# Patient Record
Sex: Male | Born: 1960 | Race: White | Hispanic: No | Marital: Married | Smoking: Never smoker
Health system: Southern US, Community
[De-identification: ages and names within clinical notes are randomized; demographics above are authoritative.]

## PROBLEM LIST (undated history)

## (undated) DIAGNOSIS — N400 Enlarged prostate without lower urinary tract symptoms: Secondary | ICD-10-CM

## (undated) DIAGNOSIS — I358 Other nonrheumatic aortic valve disorders: Secondary | ICD-10-CM

## (undated) DIAGNOSIS — E291 Testicular hypofunction: Secondary | ICD-10-CM

## (undated) DIAGNOSIS — E785 Hyperlipidemia, unspecified: Secondary | ICD-10-CM

## (undated) DIAGNOSIS — R972 Elevated prostate specific antigen [PSA]: Secondary | ICD-10-CM

## (undated) DIAGNOSIS — F419 Anxiety disorder, unspecified: Secondary | ICD-10-CM

## (undated) DIAGNOSIS — R51 Headache: Secondary | ICD-10-CM

## (undated) DIAGNOSIS — I351 Nonrheumatic aortic (valve) insufficiency: Secondary | ICD-10-CM

## (undated) DIAGNOSIS — I38 Endocarditis, valve unspecified: Secondary | ICD-10-CM

## (undated) DIAGNOSIS — I10 Essential (primary) hypertension: Secondary | ICD-10-CM

## (undated) DIAGNOSIS — N200 Calculus of kidney: Secondary | ICD-10-CM

## (undated) DIAGNOSIS — R519 Headache, unspecified: Secondary | ICD-10-CM

---

## 2013-01-13 DIAGNOSIS — N200 Calculus of kidney: Secondary | ICD-10-CM

## 2013-01-13 DIAGNOSIS — I38 Endocarditis, valve unspecified: Secondary | ICD-10-CM

## 2013-01-13 HISTORY — DX: Calculus of kidney: N20.0

## 2013-01-13 HISTORY — DX: Endocarditis, valve unspecified: I38

## 2013-07-04 ENCOUNTER — Observation Stay: Payer: Self-pay | Admitting: Student

## 2013-07-04 LAB — CBC
HCT: 41.4 % (ref 40.0–52.0)
HGB: 13.8 g/dL (ref 13.0–18.0)
MCH: 28.4 pg (ref 26.0–34.0)
MCHC: 33.4 g/dL (ref 32.0–36.0)
MCV: 85 fL (ref 80–100)
Platelet: 212 10*3/uL (ref 150–440)
RBC: 4.88 10*6/uL (ref 4.40–5.90)
RDW: 13.2 % (ref 11.5–14.5)
WBC: 11.1 10*3/uL — AB (ref 3.8–10.6)

## 2013-07-04 LAB — CK-MB
CK-MB: 4.4 ng/mL — ABNORMAL HIGH (ref 0.5–3.6)
CK-MB: 1.9 ng/mL (ref 0.5–3.6)

## 2013-07-04 LAB — COMPREHENSIVE METABOLIC PANEL
ALBUMIN: 3.6 g/dL (ref 3.4–5.0)
AST: 50 U/L — AB (ref 15–37)
Alkaline Phosphatase: 78 U/L
Anion Gap: 10 (ref 7–16)
BUN: 22 mg/dL — ABNORMAL HIGH (ref 7–18)
Bilirubin,Total: 0.6 mg/dL (ref 0.2–1.0)
CREATININE: 0.99 mg/dL (ref 0.60–1.30)
Calcium, Total: 9.7 mg/dL (ref 8.5–10.1)
Chloride: 103 mmol/L (ref 98–107)
Co2: 27 mmol/L (ref 21–32)
EGFR (African American): >60
EGFR (Non-African Amer.): >60
GLUCOSE: 103 mg/dL — AB (ref 65–99)
OSMOLALITY: 283 (ref 275–301)
Potassium: 3.5 mmol/L (ref 3.5–5.1)
SGPT (ALT): 45 U/L (ref 12–78)
SODIUM: 140 mmol/L (ref 136–145)
Total Protein: 7.1 g/dL (ref 6.4–8.2)

## 2013-07-04 LAB — TROPONIN I

## 2013-07-04 LAB — ETHANOL
Ethanol %: <0.003 % (ref 0.000–0.080)
Ethanol: <3 mg/dL

## 2013-07-05 LAB — CBC WITH DIFFERENTIAL/PLATELET
Basophil #: 0 10*3/uL (ref 0.0–0.1)
Basophil %: 0.2 %
EOS ABS: 0 10*3/uL (ref 0.0–0.7)
Eosinophil %: 0.4 %
HCT: 38.6 % — ABNORMAL LOW (ref 40.0–52.0)
HGB: 12.8 g/dL — ABNORMAL LOW (ref 13.0–18.0)
Lymphocyte #: 1.3 10*3/uL (ref 1.0–3.6)
Lymphocyte %: 11.6 %
MCH: 28.3 pg (ref 26.0–34.0)
MCHC: 33.3 g/dL (ref 32.0–36.0)
MCV: 85 fL (ref 80–100)
MONOS PCT: 8.9 %
Monocyte #: 1 x10 3/mm (ref 0.2–1.0)
Neutrophil #: 8.7 10*3/uL — ABNORMAL HIGH (ref 1.4–6.5)
Neutrophil %: 78.9 %
PLATELETS: 198 10*3/uL (ref 150–440)
RBC: 4.53 10*6/uL (ref 4.40–5.90)
RDW: 13.7 % (ref 11.5–14.5)
WBC: 11.1 10*3/uL — ABNORMAL HIGH (ref 3.8–10.6)

## 2013-07-05 LAB — BASIC METABOLIC PANEL
Anion Gap: 7 (ref 7–16)
BUN: 22 mg/dL — ABNORMAL HIGH (ref 7–18)
CALCIUM: 8.7 mg/dL (ref 8.5–10.1)
CO2: 27 mmol/L (ref 21–32)
Chloride: 107 mmol/L (ref 98–107)
Creatinine: 0.99 mg/dL (ref 0.60–1.30)
EGFR (African American): >60
Glucose: 83 mg/dL (ref 65–99)
Osmolality: 284 (ref 275–301)
Potassium: 3.5 mmol/L (ref 3.5–5.1)
Sodium: 141 mmol/L (ref 136–145)

## 2013-07-05 LAB — MAGNESIUM: Magnesium: 1.5 mg/dL — ABNORMAL LOW

## 2013-07-05 LAB — URINALYSIS, COMPLETE
BILIRUBIN, UR: NEGATIVE
GLUCOSE, UR: NEGATIVE mg/dL (ref 0–75)
Ketone: NEGATIVE
Nitrite: POSITIVE
PH: 7 (ref 4.5–8.0)
PROTEIN: NEGATIVE
SQUAMOUS EPITHELIAL: NONE SEEN
Specific Gravity: 1.014 (ref 1.003–1.030)
WBC UR: 127 /HPF (ref 0–5)

## 2013-07-07 LAB — URINE CULTURE

## 2013-07-07 LAB — CLOSTRIDIUM DIFFICILE(ARMC)

## 2013-07-10 LAB — CULTURE, BLOOD (SINGLE)

## 2013-09-26 ENCOUNTER — Ambulatory Visit: Payer: Self-pay | Admitting: Family Medicine

## 2013-09-30 ENCOUNTER — Emergency Department: Payer: Self-pay | Admitting: Emergency Medicine

## 2013-10-01 LAB — URINALYSIS, COMPLETE
BILIRUBIN, UR: NEGATIVE
Blood: NEGATIVE
Glucose,UR: NEGATIVE mg/dL (ref 0–75)
Ketone: NEGATIVE
Nitrite: NEGATIVE
PROTEIN: NEGATIVE
Ph: 6 (ref 4.5–8.0)
SPECIFIC GRAVITY: 1.019 (ref 1.003–1.030)
SQUAMOUS EPITHELIAL: NONE SEEN
WBC UR: 15 /HPF (ref 0–5)

## 2013-10-01 LAB — COMPREHENSIVE METABOLIC PANEL
ALK PHOS: 83 U/L
ALT: 44 U/L
ANION GAP: 7 (ref 7–16)
Albumin: 3.5 g/dL (ref 3.4–5.0)
BUN: 23 mg/dL — ABNORMAL HIGH (ref 7–18)
Bilirubin,Total: 0.4 mg/dL (ref 0.2–1.0)
CALCIUM: 8.7 mg/dL (ref 8.5–10.1)
CHLORIDE: 107 mmol/L (ref 98–107)
CREATININE: 1.08 mg/dL (ref 0.60–1.30)
Co2: 27 mmol/L (ref 21–32)
EGFR (African American): >60
EGFR (Non-African Amer.): >60
GLUCOSE: 111 mg/dL — AB (ref 65–99)
Osmolality: 286 (ref 275–301)
Potassium: 3.9 mmol/L (ref 3.5–5.1)
SGOT(AST): 26 U/L (ref 15–37)
Sodium: 141 mmol/L (ref 136–145)
TOTAL PROTEIN: 7.4 g/dL (ref 6.4–8.2)

## 2013-10-01 LAB — CBC
HCT: 45.2 % (ref 40.0–52.0)
HGB: 14.9 g/dL (ref 13.0–18.0)
MCH: 26.8 pg (ref 26.0–34.0)
MCHC: 33 g/dL (ref 32.0–36.0)
MCV: 81 fL (ref 80–100)
PLATELETS: 268 10*3/uL (ref 150–440)
RBC: 5.57 10*6/uL (ref 4.40–5.90)
RDW: 14 % (ref 11.5–14.5)
WBC: 6.6 10*3/uL (ref 3.8–10.6)

## 2013-10-03 LAB — URINE CULTURE

## 2013-10-31 ENCOUNTER — Ambulatory Visit: Payer: Self-pay | Admitting: Urology

## 2013-11-03 ENCOUNTER — Inpatient Hospital Stay: Payer: Self-pay

## 2013-11-03 LAB — CREATININE, SERUM
Creatinine: 0.89 mg/dL (ref 0.60–1.30)
EGFR (African American): >60

## 2013-11-03 LAB — URINALYSIS, COMPLETE
BACTERIA: NONE SEEN
Bilirubin,UR: NEGATIVE
Blood: NEGATIVE
Glucose,UR: NEGATIVE mg/dL (ref 0–75)
KETONE: NEGATIVE
Nitrite: NEGATIVE
PROTEIN: NEGATIVE
Ph: 5 (ref 4.5–8.0)
RBC,UR: 6 /HPF (ref 0–5)
Specific Gravity: 1.021 (ref 1.003–1.030)
Squamous Epithelial: 1
WBC UR: 23 /HPF (ref 0–5)

## 2013-11-04 LAB — CBC WITH DIFFERENTIAL/PLATELET
BASOS ABS: 0 10*3/uL (ref 0.0–0.1)
Basophil %: 0.5 %
EOS ABS: 0 10*3/uL (ref 0.0–0.7)
EOS PCT: 0.8 %
HCT: 37.3 % — AB (ref 40.0–52.0)
HGB: 12.3 g/dL — ABNORMAL LOW (ref 13.0–18.0)
LYMPHS PCT: 21.3 %
Lymphocyte #: 1.2 10*3/uL (ref 1.0–3.6)
MCH: 26.4 pg (ref 26.0–34.0)
MCHC: 33.1 g/dL (ref 32.0–36.0)
MCV: 80 fL (ref 80–100)
Monocyte #: 0.7 x10 3/mm (ref 0.2–1.0)
Monocyte %: 12.6 %
Neutrophil #: 3.8 10*3/uL (ref 1.4–6.5)
Neutrophil %: 64.8 %
Platelet: 187 10*3/uL (ref 150–440)
RBC: 4.68 10*6/uL (ref 4.40–5.90)
RDW: 14.2 % (ref 11.5–14.5)
WBC: 5.8 10*3/uL (ref 3.8–10.6)

## 2013-11-04 LAB — BASIC METABOLIC PANEL
ANION GAP: 8 (ref 7–16)
BUN: 23 mg/dL — AB (ref 7–18)
CO2: 28 mmol/L (ref 21–32)
Calcium, Total: 8.1 mg/dL — ABNORMAL LOW (ref 8.5–10.1)
Chloride: 105 mmol/L (ref 98–107)
Creatinine: 0.78 mg/dL (ref 0.60–1.30)
EGFR (Non-African Amer.): >60
Glucose: 128 mg/dL — ABNORMAL HIGH (ref 65–99)
Osmolality: 287 (ref 275–301)
Potassium: 3.9 mmol/L (ref 3.5–5.1)
Sodium: 141 mmol/L (ref 136–145)

## 2013-11-04 LAB — GENTAMICIN LEVEL, TROUGH: Gentamicin, Trough: <0.2 ug/mL — ABNORMAL LOW (ref 0.0–2.0)

## 2013-11-04 LAB — GENTAMICIN LEVEL, PEAK: Gentamicin, Peak: 4.8 ug/mL (ref 4.0–8.0)

## 2013-11-05 LAB — BASIC METABOLIC PANEL
Anion Gap: 7 (ref 7–16)
BUN: 17 mg/dL (ref 7–18)
CO2: 27 mmol/L (ref 21–32)
Calcium, Total: 8.2 mg/dL — ABNORMAL LOW (ref 8.5–10.1)
Chloride: 108 mmol/L — ABNORMAL HIGH (ref 98–107)
Creatinine: 0.74 mg/dL (ref 0.60–1.30)
EGFR (African American): >60
GLUCOSE: 103 mg/dL — AB (ref 65–99)
Osmolality: 285 (ref 275–301)
Potassium: 4.1 mmol/L (ref 3.5–5.1)
Sodium: 142 mmol/L (ref 136–145)

## 2013-11-05 LAB — URINE CULTURE

## 2013-11-05 LAB — VANCOMYCIN, TROUGH: Vancomycin, Trough: 9 ug/mL — ABNORMAL LOW (ref 10–20)

## 2013-11-06 LAB — BASIC METABOLIC PANEL
Anion Gap: 5 — ABNORMAL LOW (ref 7–16)
BUN: 18 mg/dL (ref 7–18)
CHLORIDE: 105 mmol/L (ref 98–107)
CO2: 31 mmol/L (ref 21–32)
Calcium, Total: 8.5 mg/dL (ref 8.5–10.1)
Creatinine: 0.83 mg/dL (ref 0.60–1.30)
EGFR (African American): >60
Glucose: 97 mg/dL (ref 65–99)
Osmolality: 283 (ref 275–301)
POTASSIUM: 4.3 mmol/L (ref 3.5–5.1)
Sodium: 141 mmol/L (ref 136–145)

## 2013-11-06 LAB — CULTURE, BLOOD (SINGLE)

## 2013-11-06 LAB — CBC WITH DIFFERENTIAL/PLATELET
Basophil #: 0 10*3/uL (ref 0.0–0.1)
Basophil %: 0.6 %
EOS PCT: 1.2 %
Eosinophil #: 0.1 10*3/uL (ref 0.0–0.7)
HCT: 38 % — AB (ref 40.0–52.0)
HGB: 12.1 g/dL — ABNORMAL LOW (ref 13.0–18.0)
Lymphocyte #: 1.7 10*3/uL (ref 1.0–3.6)
Lymphocyte %: 32.5 %
MCH: 25.5 pg — ABNORMAL LOW (ref 26.0–34.0)
MCHC: 31.9 g/dL — AB (ref 32.0–36.0)
MCV: 80 fL (ref 80–100)
MONO ABS: 0.6 x10 3/mm (ref 0.2–1.0)
MONOS PCT: 11.4 %
Neutrophil #: 2.9 10*3/uL (ref 1.4–6.5)
Neutrophil %: 54.3 %
Platelet: 228 10*3/uL (ref 150–440)
RBC: 4.75 10*6/uL (ref 4.40–5.90)
RDW: 14.1 % (ref 11.5–14.5)
WBC: 5.3 10*3/uL (ref 3.8–10.6)

## 2013-11-06 LAB — VANCOMYCIN, TROUGH: Vancomycin, Trough: 13 ug/mL (ref 10–20)

## 2013-11-07 LAB — BASIC METABOLIC PANEL
Anion Gap: 5 — ABNORMAL LOW (ref 7–16)
BUN: 23 mg/dL — ABNORMAL HIGH (ref 7–18)
CALCIUM: 8.3 mg/dL — AB (ref 8.5–10.1)
CHLORIDE: 105 mmol/L (ref 98–107)
Co2: 30 mmol/L (ref 21–32)
Creatinine: 0.83 mg/dL (ref 0.60–1.30)
EGFR (African American): >60
EGFR (Non-African Amer.): >60
Glucose: 103 mg/dL — ABNORMAL HIGH (ref 65–99)
OSMOLALITY: 283 (ref 275–301)
Potassium: 4 mmol/L (ref 3.5–5.1)
SODIUM: 140 mmol/L (ref 136–145)

## 2013-11-07 LAB — CBC WITH DIFFERENTIAL/PLATELET
Basophil #: 0 10*3/uL (ref 0.0–0.1)
Basophil %: 0.6 %
EOS ABS: 0.1 10*3/uL (ref 0.0–0.7)
Eosinophil %: 1 %
HCT: 36.2 % — ABNORMAL LOW (ref 40.0–52.0)
HGB: 11.8 g/dL — ABNORMAL LOW (ref 13.0–18.0)
Lymphocyte #: 1.6 10*3/uL (ref 1.0–3.6)
Lymphocyte %: 30.9 %
MCH: 25.9 pg — ABNORMAL LOW (ref 26.0–34.0)
MCHC: 32.7 g/dL (ref 32.0–36.0)
MCV: 79 fL — ABNORMAL LOW (ref 80–100)
Monocyte #: 0.5 x10 3/mm (ref 0.2–1.0)
Monocyte %: 10.7 %
NEUTROS PCT: 56.8 %
Neutrophil #: 2.9 10*3/uL (ref 1.4–6.5)
Platelet: 235 10*3/uL (ref 150–440)
RBC: 4.58 10*6/uL (ref 4.40–5.90)
RDW: 13.8 % (ref 11.5–14.5)
WBC: 5.1 10*3/uL (ref 3.8–10.6)

## 2013-11-09 LAB — CULTURE, BLOOD (SINGLE)

## 2013-11-14 LAB — CULTURE, BLOOD (SINGLE)

## 2014-05-06 NOTE — Discharge Summary (Signed)
PATIENT NAME:  Jonathan Carter, Jonathan Carter MR#:  [id number] DATE OF BIRTH:  1960/07/04  DATE OF ADMISSION:  07/04/2013 DATE OF DISCHARGE:  07/07/2013  PRIMARY CARE PHYSICIAN: Dr. Dossie Arbourrissman  PRIMARY CARDIOLOGIST: Dr. Darrold JunkerParaschos   CHIEF COMPLAINT: Syncope.   DISCHARGE DIAGNOSES: 1.  Syncope, likely vasovagal in nature.  2.  Urinary tract infection.  3.  Hypertension.  4.  Antibiotic-associated diarrhea.   DISCHARGE MEDICATIONS: Keflex 500 mg 2 times a day for 8 days, HCTZ/losartan 12.5/100 mg 1 tab once a day, Flonase 50 mcg 1 spray once a day, testosterone 100 mg/mL intramuscular as previously used.  DISCHARGE DIET: Low sodium.   DISCHARGE ACTIVITY: As tolerated.   DISCHARGE FOLLOWUP: Please follow with PCP and cardiology, Dr. Darrold JunkerParaschos, within 1 to 2 weeks. If repeat UTIs, please a urologist. If persistent diarrhea, call your doctor right away. Take probiotics for 3 weeks and eat plenty of yogurt.  DISPOSITION: Home.   SIGNIFICANT LABS AND IMAGING: Blood cultures: No growth to date. C. diff is negative today. Initial UA was positive for 3+ leukocyte esterase, positive nitrites, 2+ bacteria, 127 WBCs, 14 RBCs. Urine cultures grew E. coli susceptible to ceftriaxone and cefazolin. Initial white count 11.1.   CT of head without contrast for syncope was negative for acute abnormalities.   Echocardiogram showed normal EF of 55% to 60%.  X-ray, PA and lateral of the chest, on the 23rd: No acute cardiopulmonary disease.   HOSPITAL COURSE: For full details of H and P, please see the dictation on June 22nd by Dr. Amado CoeGouru, but briefly this is a pleasant 54 year old [place] male with hypertension who came in with some epigastric pain associated with some nausea, chills in the morning. Of note, [place] He had gone to the bathroom and sat on the commode and passed out. Per notes, per EMS, there were possible pauses on the EKG. He was admitted to the hospitalist service for observation. He  does see Dr. Darrold JunkerParaschos as an outpatient for his hypertension and heart. He has no congestive heart failure or heart issues. He was admitted for syncope. He was placed on telemetry, and he was ruled out for acute coronary syndrome as cardiology was consulted. He did have some low grade fevers, but did have a temperature of 101.8 on the 23rd. Blood cultures and urine cultures were sent, UA was sent, and the patient was noted to have a UTI and was started on ceftriaxone. The cultures at this point is E. coli and its is susceptible to cephalosporins, and he will be discharged on Keflex. Per cardiology he likely had vasovagal syncope. He was seen by Dr. Juliann Paresallwood of note. At this time, he is neurologically intact. The short pauses seen by EMS were likely due to vasovagal response per cardiology, and he has had no significant arrhythmias while he has been on tele.  PHYSICAL EXAMINATION: VITAL SIGNS: On the day of discharge, temperature is 99.4, pulse rate 94, respiratory rate 17, blood pressure 111/77, and O2 sat 95%. Last documented fever is 101.8 on June 23rd.  GENERAL: The patient is a generally well-developed male. He is ambulating without difficulty. HEENT: Normocephalic, atraumatic.  LUNGS: Clear.  HEART: Normal S1, S2.  ABDOMEN: Benign.  EXTREMITIES: No edema.   The patient is FULL code.   TOTAL TIME SPENT: About 33 minutes.   ___________________________ Krystal EatonShayiq Ahmadzia, MD sa:sb D: 07/07/2013 12:32:26 ET T: 07/07/2013 14:26:56 ET JOB#: [id number]  cc: Krystal EatonShayiq Ahmadzia, MD, <Dictator> Steele SizerMark A. Crissman, MD Marcina MillardAlexander Paraschos,  MD  Krystal Eaton MD ELECTRONICALLY SIGNED 07/12/2013 18:19

## 2014-05-06 NOTE — H&P (Signed)
PATIENT NAME:  Jonathan Carter, Jonathan Carter MR#:  [id number] DATE OF BIRTH:  03/07/1960  DATE OF ADMISSION:  07/04/2013  PRIMARY CARE PHYSICIAN: Dr. Dossie Arbour  REFERRING PHYSICIAN: Dr. Dolores Frame  CARDIOLOGIST: Dr. Darrold Junker   CHIEF COMPLAINT: Passing out.   HISTORY OF PRESENT ILLNESS: The patient is a 54 year old male with past medical history of hypertension who was feeling some epigastric discomfort this morning associated with nausea. The patient was having some chills this morning.  As he was feeling severe nausea, he went to the bathroom and sat on the commode. Eventually, he passed out for approximately 2 to 3 minutes, as reported by his wife. The patient could not recall the incident, but he was concerned that he passed out and came into the ED. CT of head and 12-lead EKG did not reveal any acute changes. Orthostatics were done, which were also normal. The patient's first set of troponin is negative, but on the tele strip some pauses were noted for 3 to 4 seconds as reported by the ED physician. The patient never had any symptoms in the past. Denies any headache, blurry vision, shortness of breath. No chest pain. He follows with Dr. Darrold Junker as an outpatient for his hypertension.   PAST MEDICAL HISTORY: Hypertension.   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   ALLERGIES: No known drug allergies.   PSYCHOSOCIAL HISTORY: Lives at home with wife. Denies any history of smoking, alcohol or illicit drug usage.   FAMILY HISTORY: Father had a heart condition and deceased with some unknown cancer.   HOME MEDICATIONS : Testosterone intramuscularly once a month, hydrochlorothiazide/losartan 12.5/100 one tablet once daily, Flonase 1 spray inhalation once a day.  REVIEW OF SYSTEMS: CONSTITUTIONAL: Denies any fever or fatigue. Complaining of some chills.  EYES: Denies blurry vision, double vision, glaucoma.  ENT: Denies epistaxis, discharge, tinnitus.  RESPIRATORY: Denies  cough, COPD. CARDIOVASCULAR: No chest pain, dizziness or palpitations, but had syncope after feeling nauseated. GASTROINTESTINAL: Complaining of nausea. Denies vomiting, diarrhea. Some epigastric discomfort. Denies any abdominal pain, hematemesis, melena.  GENITOURINARY: No dysuria, hematuria, renal calculi, frequency.  ENDOCRINE: Denies polyuria, nocturia, thyroid problems.  HEMATOLOGIC AND LYMPHATIC: No anemia, easy bruising, bleeding.  INTEGUMENTARY: No acne, rash, lesions.  MUSCULOSKELETAL: No joint pain in the neck and back. Denies gout.  NEUROLOGIC: Denies vertigo, ataxia, dementia, headache.  PSYCHIATRIC: No ADD, OCD, bipolar disorder.   PHYSICAL EXAMINATION: VITAL SIGNS: Temperature 98.3, pulse 96, respirations 20, blood pressure 110/70, pulse ox 98% on room air. Orthostatic vital signs: Blood pressure during lying 115/72 with pulse 90; standing up blood pressure 120/72 with pulse 102; sitting blood pressure 119/75 with pulse 95.  GENERAL APPEARANCE: Not in acute distress. Moderately built and obese.  HEENT: Normocephalic, atraumatic. Pupils are equal and reacting to light and accommodation. No scleral icterus. No conjunctival injection. No sinus tenderness. No postnasal drip. Moist mucous membranes.  NECK: Supple. No JVD. No thyromegaly. Range of motion is intact. No axillary muscle usage. No anterior chest wall tenderness on palpation.  CARDIAC: S1, S2 normal. Regular rate and rhythm. No murmurs.  GASTROINTESTINAL: Soft. Bowel sounds are positive in all 4 quadrants. Nontender, nondistended. No hepatosplenomegaly. No masses.  NEUROLOGIC: Awake, alert, and oriented x3. Following verbal commands, answering all questions appropriately. Motor and sensory are intact. Reflexes are 2+.  EXTREMITIES: No edema, cyanosis or clubbing.  SKIN: Warm to touch. Normal turgor. No rashes. No lesions.  MUSCULOSKELETAL: No joint effusion, tenderness, erythema.  PSYCHIATRIC: Normal mood and  affect.  DIAGNOSTIC DATA: LFTs are normal, except AST at 50. Troponin less than 0.02. CBC is normal, except WBC at 11.1. BMP is normal, except glucose at 103 and BUN 22. The rest of the BMP is normal.   Twelve-lead EKG: Normal sinus rhythm at 81 beats per minute, normal PR and QRS interval. No acute ST-T wave changes.   CT of the head without contrast: No acute intracranial abnormality.   Chest x-ray, portable: No acute cardiopulmonary process for this low inspiratory portable examination.   ASSESSMENT AND PLAN: A 54 year old male with nausea and some chills and syncope. 1.  Syncope. Probably vasovagal versus orthostatic, also some telemetry strip changes with 2 to 3 second pauses intermittently. Will admit him to telemetry, cycle cardiac biomarkers. Will  obtain an echocardiogram and cardiology consult is placed to Dr. Darrold JunkerParaschos. We will provide him gentle hydration. Will provide antinausea medication. Neuro checks with vital signs.  2.  Nausea and epigastric discomfort, which could be from gastroesophageal reflux disease or peptic ulcer disease. We will provide him ranitidine.  3.  Hypertension. Resume his home medication and up titrate medication on as needed basis.  4.  We will provide him gastrointestinal and deep vein thrombosis prophylaxis.  He is FULL code. Wife is the medical power of attorney. Plan of care discussed with the patient. He is aware of the plan. T  TOTAL TIME SPENT ON ADMISSION: 45 minutes. ____________________________ Ramonita LabAruna Gouru, MD ag:sb D: 07/04/2013 08:08:43 ET T: 07/04/2013 08:49:57 ET JOB#: [id number]  cc: Ramonita LabAruna Gouru, MD, <Dictator> Steele SizerMark A. Crissman, MD Ramonita LabARUNA GOURU MD ELECTRONICALLY SIGNED 07/06/2013 7:27

## 2014-05-06 NOTE — Consult Note (Signed)
PATIENT NAME:  Jonathan Carter, Jonathan Carter MR#:  [id number] DATE OF BIRTH:  [birth year]  DATE OF CONSULTATION:  07/05/2013  REFERRING PHYSICIAN:   CONSULTING PHYSICIAN:  Dwayne D. Juliann Pares, MD  PRIMARY CARE PHYSICIAN:  Dr. Dossie Arbour.  PAST CARDIOLOGIST:  Dr. Darrold Junker. REASON FOR CONSULTATION: Episode of what sounds like syncope.  HISTORY OF PRESENT ILLNESS: The patient is a 54 year old  [place] male, history of hypertension, started having some epigastric discomfort with nausea, sweating, had some chills that morning, went to the bathroom with severe nausea, sat on the commode, eventually passed out for 2 or 3 minutes as reported by his wife. Patient could not recall the incident, but was concerned that he passed out. He came to the ED, CT of the head and EKG were unremarkable. Orthostatics were done which were normal. The patient's first set of troponins were negative.  On Telemetry , states he was found to 2-3 second, what sound like pauses reported to the ED by ED physician . The patient never had any symptoms in the past. Denies any headache.  No blurred vision. No shortness of breath. No chest pain. He is followed by Dr. Darrold Junker as an outpatient for hypertension mostly.   PAST MEDICAL HISTORY: Hypertension.   PAST SURGICAL HISTORY: Nasal septal deviation repair as a child.   ALLERGIES: NONE.   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 No smoking. No alcohol consumption.   FAMILY HISTORY: Heart condition and cancer.   HOME MEDICATIONS: Testosterone, hydrochlorothiazide/losartan which is 12.5/100, and Flonase nasal spray.   REVIEW OF SYSTEMS: Episode of possible syncope. He has had nausea, chills, weakness, fatigue. No weight loss or weight gain, No hemoptysis or hematemesis. No bright red blood per rectum.  No vision change or hearing change.  No sputum production or cough.   PHYSICAL EXAMINATION:  VITAL SIGNS: Blood pressure was a 115/70, pulse of 90, respiratory rate of 16,  afebrile.  HEENT: Normocephalic, atraumatic. Pupils equal and reactive to light.  NECK: Supple. No significant JVD, bruits, or adenopathy.  LUNGS: Exam was clear to auscultation and percussion. No significant wheeze, rhonchi, or rales.  HEART: Regular rate and rhythm. No murmurs, gallops, or rubs.  ABDOMEN: Benign.  EXTREMITY: Normal intact.  SKIN: Normal.   RADIOLOGIC DATA:  EKG: Normal sinus rhythm, rate of 80, and nonspecific ST-T changes. CT of the head negative. Chest x-ray negative.   LABORATORIES: Normal except for UA. LFTs normal. Troponin less than 0.02. White count was 11. BMP normal. BUN 22. UA looks to show possible urinary tract infection.   IMPRESSION: Syncope, possibly vasovagal, gastroenteritis, possible urinary tract infection and/ or prostatitis, and hypertension.   PLAN:  1.  Agree with admit. Follow up cardiac enzymes. Follow up telemetry with these questionable pauses, possibly vasovagal in nature, as well as his gastrointestinal symptoms.  2.  Continue to treat urinary tract infection and prostatitis. 3.  Fluid resuscitation and hydration.  4.  Continue hypertension control. 5.  See if the patient improves after antibiotic therapy and treatment for gastroenteritis. Do not recommend permanent pacemaker placement at this point. I do not recommend invasive cardiac studies at this point. We will have the patient follow up with Dr. Darrold Junker. Consider Holter monitor. No clear indication for a permanent pacemaker placement. The patient should be treated conservatively from a cardiology standpoint. Can probably be followed up as an outpatient from cardiology standpoint.  Have him follow up with Dr. Darrold Junker.    ____________________________ Bobbie Stack. Juliann Pares, MD ddc:ts D: 07/07/2013 95:62:13  ET T: 07/07/2013 12:21:44 ET JOB#: [id number]  cc: Dwayne D. Juliann Paresallwood, MD, <Dictator> Alwyn PeaWAYNE D CALLWOOD MD ELECTRONICALLY SIGNED 08/08/2013 15:29

## 2014-05-06 NOTE — Discharge Summary (Signed)
PATIENT NAME:  Jonathan Carter, Jonathan Carter MR#:  [id number] DATE OF BIRTH:  [birth year]  DATE OF ADMISSION:  11/03/2013 DATE OF DISCHARGE:  11/07/2013  DISCHARGE DIAGNOSIS:  Enterococcal aortic valve endocarditis.   HISTORY OF PRESENT ILLNESS: Please see initial history and physical for details. Briefly this is a 54 year old gentleman evaluated for fever of unknown origin and found to have bacteremia with enterococcus as well as aortic valve vegetation on a transthoracic echocardiogram. He was admitted for further evaluation and treatment. He had a PICC line placed after he cleared his cultures. He was treated initially with broad-spectrum antibiotics, but once organisms were available he was discharged on continuous penicillin 24,000,000 units q. 24 hours as well as gentamicin 80 mg every day. He will follow up with me in 2 weeks and will need weekly laboratories. His other issues remain the same.   [facility] MEDICATIONS: [facility]   DISCHARGE FOLLOWUP:  Dr. Sampson GoonFitzgerald in 1-2 weeks as well as primary care within 1-2 months.    This discharge took 40 minutes.    ____________________________ Stann Mainlandavid P. Sampson GoonFitzgerald, MD dpf:bu D: 12/05/2013 14:05:08 ET T: 12/05/2013 15:54:55 ET JOB#: [id number]  cc: Stann Mainlandavid P. Sampson GoonFitzgerald, MD, <Dictator> Trust Crago Sampson GoonFITZGERALD MD ELECTRONICALLY SIGNED 12/06/2013 15:41

## 2014-05-06 NOTE — Consult Note (Signed)
Chief Complaint:  Subjective/Chief Complaint Pt states to feel much better today. No syncope no f/c/s. No weakness   VITAL SIGNS/ANCILLARY NOTES: **Vital Signs.:   25-Jun-15 07:53  Vital Signs Type Q 8hr  Temperature Temperature (F) 99.4  Celsius 37.4  Temperature Source oral  Pulse Pulse 94  Respirations Respirations 17  Systolic BP Systolic BP 161  Diastolic BP (mmHg) Diastolic BP (mmHg) 77  Mean BP 88  Pulse Ox % Pulse Ox % 95  Pulse Ox Activity Level  At rest  Oxygen Delivery Room Air/ 21 %  *Intake and Output.:   Daily 25-Jun-15 07:00  Grand Totals Intake:  720 Output:      Net:  720 24 Hr.:  720  Oral Intake      In:  720  Urine ml     Out:  0  Length of Stay Totals Intake:  1811 Output:  860    Net:  951   Brief Assessment:  GEN well developed, well nourished, no acute distress   Cardiac Regular   Respiratory normal resp effort  clear BS   Gastrointestinal Normal   Gastrointestinal details normal Soft   EXTR negative cyanosis/clubbing, negative edema   Lab Results: LabObservation:  23-Jun-15 08:47   OBSERVATION Reason for Test  Routine Micro:  23-Jun-15 10:32   Micro Text Report URINE CULTURE   ORGANISM 1                >100,000 CFU/ML ESCHERICHIA COLI   ANTIBIOTIC                    ORG#1     AMPICILLIN                    R         CEFAZOLIN                     S         CEFOXITIN                     S         CEFTRIAXONE                   S         CIPROFLOXACIN                 S         GENTAMICIN                    S         IMIPENEM                      S         LEVOFLOXACIN                  S         NITROFURANTOIN                S         TRIMETHOPRIM/SULFAMETHOXAZOLE R  Culture Comment SENSITIVITIES TO FOLLOW  Result(s) reported on 06 Jul 2013 at 09:41AM.  Organism Name ESCHERICHIA COLI  Organism Quantity >100,000 CFU/ML  Nitrofurantoin Sensitivity S  Cefazolin Sensitivity S  Ampicillin Sensitivity R  Ceftriaxone Sensitivity S   Ciprofloxacin Sensitivity S  Gentamicin Sensitivity S  Imipenem Sensitivity S  Levofloxacin Sensitivity S  Trimethoprim/Sulfamethoxazole Sensitivty  R  Cefoxitin Sensitivity S  Specimen Source CLEAN CATCH  Organism 1 >100,000 CFU/ML ESCHERICHIA COLI  Result(s) reported on 07 Jul 2013 at 08:26AM.    18:07   Micro Text Report BLOOD CULTURE   COMMENT                   NO GROWTH IN 18-24 HOURS   ANTIBIOTIC                       Culture Comment NO GROWTH IN 18-24 HOURS  Result(s) reported on 06 Jul 2013 at 06:00PM.    18:09   Micro Text Report BLOOD CULTURE   COMMENT                   NO GROWTH IN 18-24 HOURS   ANTIBIOTIC                       Culture Comment NO GROWTH IN 18-24 HOURS  Result(s) reported on 06 Jul 2013 at 06:00PM.  Cardiology:  23-Jun-15 08:47   Echo Doppler REASON FOR EXAM:     COMMENTS:     PROCEDURE: [facility] - ECHO DOPPLER COMPLETE(TRANSTHOR)  - Jul 05 2013  8:47AM   RESULT: Echocardiogram Report  Patient Name:   Jonathan Carter Date of Exam: 07/05/2013 Medical Rec #:  [id number]            Custom1: Date of Birth:  05-19-1960          Height:       2496.0 in Patient Age:    54 years          Weight: Patient Gender: M                 BSA:  Indications: Syncope Sonographer:    Sherrie Sport RDCS Referring Phys: Nicholes Mango  Summary:  1. Left ventricular ejection fraction, by visual estimation, is 55 to  60%.  2. Normal global left ventricular systolic function.  3. Mildly dilated left atrium. 2D AND M-MODE MEASUREMENTS (normal ranges within parentheses): Left Ventricle:          Normal IVSd (2D):      1.23 cm (0.7-1.1) LVPWd (2D):     1.09 cm (0.7-1.1) Aorta/LA:                  Normal LVIDd (2D):     5.14 cm (3.4-5.7) Aortic Root (2D): 3.40 cm (2.4-3.7) LVIDs (2D):     3.48 cm           Left Atrium (2D): 4.50 cm (1.9-4.0) LV FS (2D):     32.3 %   (>25%) LV EF (2D):     60.2 %   (>50%)                                   Right Ventricle:                                    RVd (2D):        9.56 cm LV DIASTOLIC FUNCTION: MV Peak E: 0.99 m/s E/e' Ratio: 14.90 MV Peak A: 0.95 m/s Decel Time: 164 msec E/A Ratio: 1.04 SPECTRAL DOPPLER ANALYSIS (where applicable): Mitral Valve: MV P1/2 Time: 47.56 msec MV Area, PHT: 4.63 cm?? Aortic Valve: AoV Max  Vel: 1.27 m/s AoV Peak PG: 6.5 mmHg AoV Mean PG: LVOT Vmax: 1.04 m/sLVOT VTI:  LVOT Diameter: 2.20 cm AoV Area, Vmax: 3.11 cm?? AoV Area, VTI:  AoV Area, Vmn: Tricuspid Valve and PA/RV Systolic Pressure: TR Max Velocity: 2.13 m/s RA  Pressure: 5 mmHg RVSP/PASP: 23.2 mmHg Pulmonic Valve: PV Max Velocity: 1.18 m/s PV Max PG: 5.6 mmHg PV Mean PG:  PHYSICIAN INTERPRETATION: Left Ventricle: The left ventricular internal cavity size was normal. LV  septal wall thickness was normal. LV posterior wall thickness was normal.  Global LV systolic function was normal. Left ventricular ejection  fraction, by visual estimation, is 55 to 60%. Right Ventricle: The right ventricular size is normal. Global RV systolic  function is normal. Left Atrium: The left atrium is mildly dilated. Right Atrium: The right atrium is normal in size. Pericardium: There is no evidence of pericardial effusion. Mitral Valve: The mitral valve is normal in structure. Trace mitral valve  regurgitation is seen. Tricuspid Valve: The tricuspid valve is normal. Trivial tricuspid  regurgitation is visualized. The tricuspid regurgitant velocity is 2.13  m/s, and with an assumed right atrial pressure of 5 mmHg, the estimated  right ventricular systolic pressure is normal at 23.2 mmHg. Aortic Valve: The aortic valve is normal. No evidence of aortic valve  regurgitation is seen. Pulmonic Valve: The pulmonic valve is normal.  [place] MD Electronically signed by Brandon MD Signature Date/Time: 07/05/2013/5:27:13 PM  *** Final ***  IMPRESSION:.  Verified By: Yolonda Kida, M.D., MD  Routine Chem:  23-Jun-15  04:11   Glucose, Serum 83  BUN  22  Creatinine (comp) 0.99  Sodium, Serum 141  Potassium, Serum 3.5  Chloride, Serum 107  CO2, Serum 27  Calcium (Total), Serum 8.7  Anion Gap 7  Osmolality (calc) 284  eGFR (African American) >60  eGFR (Non-African American) >60 (eGFR values <14m/min/1.73 m2 may be an indication of chronic kidney disease (CKD). Calculated eGFR is useful in patients with stable renal function. The eGFR calculation will not be reliable in acutely ill patients when serum creatinine is changing rapidly. It is not useful in  patients on dialysis. The eGFR calculation may not be applicable to patients at the low and high extremes of body sizes, pregnant women, and vegetarians.)  Magnesium, Serum  1.5 (1.8-2.4 THERAPEUTIC RANGE: 4-7 mg/dL TOXIC: > 10 mg/dL  -----------------------)  Routine UA:  23-Jun-15 10:32   Color (UA) Yellow  Clarity (UA) Hazy  Glucose (UA) Negative  Bilirubin (UA) Negative  Ketones (UA) Negative  Specific Gravity (UA) 1.014  Blood (UA) 1+  pH (UA) 7.0  Protein (UA) Negative  Nitrite (UA) Positive  Leukocyte Esterase (UA) 3+ (Result(s) reported on 05 Jul 2013 at 11:05AM.)  RBC (UA) 14 /HPF  WBC (UA) 127 /HPF  Bacteria (UA) 2+  Epithelial Cells (UA) NONE SEEN  WBC Clump (UA) PRESENT  Mucous (UA) PRESENT (Result(s) reported on 05 Jul 2013 at 11:05AM.)  Routine Hem:  23-Jun-15 04:11   WBC (CBC)  11.1  RBC (CBC) 4.53  Hemoglobin (CBC)  12.8  Hematocrit (CBC)  38.6  Platelet Count (CBC) 198  MCV 85  MCH 28.3  MCHC 33.3  RDW 13.7  Neutrophil % 78.9  Lymphocyte % 11.6  Monocyte % 8.9  Eosinophil % 0.4  Basophil % 0.2  Neutrophil #  8.7  Lymphocyte # 1.3  Monocyte # 1.0  Eosinophil # 0.0  Basophil # 0.0 (Result(s) reported on 05 Jul 2013 at 05:30AM.)  Radiology Results: XRay:    22-Jun-15 06:36, Chest Portable Single View  Chest Portable Single View   REASON FOR EXAM:    syncope  COMMENTS:       PROCEDURE: DXR - DXR  PORTABLE CHEST SINGLE VIEW  - Jul 04 2013  6:36AM     CLINICAL DATA:  Syncopal episode.    EXAM:  PORTABLE CHEST - 1 VIEW    COMPARISON:  None.    FINDINGS:  Cardiomediastinal silhouette is unremarkable for this low  inspiratory portable examination with crowded vasculature markings.  The lungs are clear without pleural effusions or focal  consolidations. Trachea projects midline and there is no  pneumothorax. Included soft tissue planes and osseous structures are  non-suspicious. Mild degenerative change of thoracic spine.     IMPRESSION:  No acute cardiopulmonary process for this low inspiratory portable  examination.      Electronically Signed    By: Elon Alas    On:07/04/2013 06:39       Verified By: Ricky Ala, M.D.,    23-Jun-15 09:57, Chest PA and Lateral  Chest PA and Lateral   REASON FOR EXAM:    Fever, Pneumonia  COMMENTS:       PROCEDURE: DXR - DXR CHEST PA (OR AP) AND LATERAL  - Jul 05 2013  9:57AM     CLINICAL DATA:  Fever, Pneumonia    EXAM:  CHEST - 2 VIEW    COMPARISON:  07/04/2013    FINDINGS:  Lungs are clear. Heart size and mediastinal contours are within  normal limits.  No effusion.  Mild spondylitic changes in the lower thoracic spine. Tortuous  thoracic aorta.     IMPRESSION:  No acute cardiopulmonary disease.      Electronically Signed    By: Arne Cleveland M.D.    On: 07/05/2013 10:07         Verified By: Kandis Cocking, M.D.,  [place]:    23-Jun-15 15:11, US Kidney Bilateral  US Kidney Bilateral   REASON FOR EXAM:    UTI  COMMENTS:       PROCEDURE: [place]  - US KIDNEY  - Jul 05 2013  3:11PM     CLINICAL DATA:  Urinary tract infection.    EXAM:  RENAL/URINARY TRACT ULTRASOUND COMPLETE    COMPARISON:  None.    FINDINGS:  Right Kidney:  Length: 12 cm. Normal renal cortical thickness and echogenicity.  Suggestion of a 1 cm shadowing echogenic focus within the inferior  pole of the right kidney.  Additionally within the inferior pole of  the right kidney there is a 1.4 cm cyst.    Left Kidney:    Length: 14.3 cm. Echogenicity within normal limits. No mass or  hydronephrosis visualized.    Bladder:    Appears normal for degree of bladder distention.     IMPRESSION:  1. Probable nonobstructing stone inferior pole of the right kidney.  2. Suggestion of a cyst within the inferior pole of the right  kidney.  3. No hydronephrosis.      Electronically Signed    By: Lovey Newcomer M.D.    On: 07/05/2013 15:19         Verified By: Ilsa Iha, M.D.,  Cardiology:    22-Jun-15 05:43, ED ECG  Ventricular Rate 81  Atrial Rate 81  P-R Interval 160  QRS Duration 112  QT 366  QTc 425  P Axis 32  R Axis -34  T Axis  7  ECG interpretation   Normal sinus rhythm  Left axis deviation  Minimal voltage criteria for LVH, may be normal variant  Abnormal ECG  No previous ECGs available  ----------unconfirmed----------  Confirmed by OVERREAD, NOT ([id number]), editor PEARSON, BARBARA ([id number]) on 07/04/2013 1:04:57 PM  ED ECG     23-Jun-15 08:47, Echo Doppler  Echo Doppler   REASON FOR EXAM:      COMMENTS:       PROCEDURE: [place] - ECHO DOPPLER COMPLETE(TRANSTHOR)  - Jul 05 2013  8:47AM     RESULT: Echocardiogram Report    Patient Name:   Jonathan Carter Date of Exam: 07/05/2013  Medical Rec #:  [id number]            Custom1:  Date of Birth:  23-Feb-1960          Height:       2496.0 in  Patient Age:    1 years          Weight:  Patient Gender: M                 BSA:    Indications: Syncope  Sonographer:    Sherrie Sport RDCS  Referring Phys: Nicholes Mango    Summary:   1. Left ventricular ejection fraction, by visual estimation, is 55 to   60%.   2. Normal global left ventricular systolic function.   3. Mildly dilated left atrium.  2D AND M-MODE MEASUREMENTS (normal ranges within parentheses):  Left Ventricle:          Normal  IVSd (2D):      1.23 cm (0.7-1.1)  LVPWd (2D):     1.09 cm  (0.7-1.1) Aorta/LA:                  Normal  LVIDd (2D):     5.14 cm (3.4-5.7) Aortic Root (2D): 3.40 cm (2.4-3.7)  LVIDs (2D):     3.48 cm           Left Atrium (2D): 4.50 cm (1.9-4.0)  LV FS (2D):     32.3 %   (>25%)  LV EF (2D):     60.2 %   (>50%)                                    Right Ventricle:                                    RVd (2D):        9.50 cm  LV DIASTOLIC FUNCTION:  MV Peak E: 0.99 m/s E/e' Ratio: 14.90  MV Peak A: 0.95 m/s Decel Time: 164 msec  E/A Ratio: 1.04  SPECTRAL DOPPLER ANALYSIS (where applicable):  Mitral Valve:  MV P1/2 Time: 47.56 msec  MV Area, PHT: 4.63 cm??  Aortic Valve: AoV Max Vel: 1.27 m/s AoV Peak PG: 6.5 mmHg AoV Mean PG:  LVOT Vmax: 1.04 m/sLVOT VTI:  LVOT Diameter: 2.20 cm  AoV Area, Vmax: 3.11 cm?? AoV Area, VTI:  AoV Area, Vmn:  Tricuspid Valve and PA/RV Systolic Pressure: TR Max Velocity: 2.13 m/s RA   Pressure: 5 mmHg RVSP/PASP: 23.2 mmHg  Pulmonic Valve:  PV Max Velocity: 1.18 m/s PV Max PG: 5.6 mmHg PV Mean PG:    PHYSICIAN INTERPRETATION:  Left Ventricle: The left ventricular internal cavity size was  normal. LV   septal wall thickness was normal. LV posterior wall thickness was normal.   Global LV systolic function was normal. Left ventricular ejection   fraction, by visual estimation, is 55 to 60%.  Right Ventricle: The right ventricular size is normal. Global RV systolic   function is normal.  Left Atrium: The left atrium is mildly dilated.  Right Atrium: The right atrium is normal in size.  Pericardium: There is no evidence of pericardial effusion.  Mitral Valve: The mitral valve is normal in structure. Trace mitral valve   regurgitation is seen.  Tricuspid Valve: The tricuspid valve is normal. Trivial tricuspid   regurgitation is visualized. The tricuspid regurgitant velocity is 2.13   m/s, and with an assumed right atrial pressure of 5 mmHg, the estimated   right ventricular systolic pressure is normal at 23.2 mmHg.  Aortic  Valve: The aortic valve is normal. No evidence of aortic valve   regurgitation is seen.  Pulmonic Valve: The pulmonic valve is normal.    [place] MD  Electronically signed by [place] MD  Signature Date/Time: 07/05/2013/5:27:13 PM    *** Final ***    IMPRESSION:.    Verified By: Yolonda Kida, M.D., MD  CT:    22-Jun-15 06:38, CT Head Without Contrast  CT Head Without Contrast   REASON FOR EXAM:    SYNCOPE  COMMENTS:       PROCEDURE: CT  - CT HEAD WITHOUT CONTRAST  - Jul 04 2013  6:38AM     CLINICAL DATA:  Syncope. Loss of consciousness for 3 min. Irregular  heart rate.    EXAM:  CT HEAD WITHOUT CONTRAST    TECHNIQUE:  Contiguous axial images were obtained from the base of the skull  through the vertex without intravenous contrast.  COMPARISON:  None.    FINDINGS:  Ventricles and sulci appear symmetrical. No mass effect or midline  shift. No abnormal extra-axial fluid collections. Gray-white matter  junctions are distinct. Basal cisterns are not effaced. No evidence  of acute intracranial hemorrhage. No depressed skull fractures.  Circumscribed lucent lesions in the calvarium probably represent  prominent venous lakes. Paranasal sinuses and mastoid air cells are  not opacified.     IMPRESSION:  No acute intracranial abnormalities.    Electronically Signed    By: Lucienne Capers M.D.    On: 07/04/2013 06:42         Verified By: Neale Burly, M.D.,   Assessment/Plan:  Assessment/Plan:  Assessment IMP Syncope UTI Prostatits? HTN Gastroentritis .   Plan PLAN Contiinue Antibx therapy Continue po hydration  Bp control OTC omeprezole for gastroenteritis Syncope probalbly vasovagal 2ndaryto UTI. F/U AP 1-2 weeks   Electronic Signatures: Lujean Amel D (MD)  (Signed 25-Jun-15 13:07)  Authored: Chief Complaint, VITAL SIGNS/ANCILLARY NOTES, Brief Assessment, Lab Results, Radiology Results, Assessment/Plan   Last  Updated: 25-Jun-15 13:07 by Lujean Amel D (MD)

## 2014-05-06 NOTE — H&P (Signed)
PATIENT NAME:  Jonathan Carter, Jonathan Carter MR#:  [id number] DATE OF BIRTH:  [birth year]  DATE OF ADMISSION:  11/03/2013    PRIMARY CARE PHYSICIAN: Vonita Moss, MD   REASON FOR ADMISSION:  Endocarditis.   HISTORY OF PRESENT ILLNESS: This is a very pleasant 54 year old gentleman with a history of hypertension, hyperlipidemia, migraines and nephrolithiasis who I saw in infectious disease consultation yesterday. He has been ill since June of this year.  He started feeling nauseous after he ate a large hamburger and then had an episode of syncope.  He was admitted 06/22 to the 25th. He had a cardiac work-up negative at that time, but he did have a fever to 102. Blood cultures were negative, but urinalysis and urine cultures were positive with E. coli. He was treated with Keflex.  He then continued to feel ill off and on with low-grade fevers. He has lost 20 pounds. He passed a kidney stone when he was in [place].  He has continued to follow with Dr. Artis Flock for this.  He has had an IVP and a CT of his abdomen and pelvis that only showed stones.  He did, however, have repeat urine analysis showing enterococcal urinary tract infection in September of this year.  His other symptoms have included no appetite, some anxiety attacks where he gets cold and clammy. He has also had a cough off and on for a few days and myalgias.  He has also had a few days of loose stool.  He has been treated with antibiotics including up to a month of levofloxacin.  He just finished that on Tuesday, 10/20.  He denies any dysuria, hematuria.  He has had no rash or joint swelling.    PAST MEDICAL HISTORY:  Hypertension, migraines, hyperlipidemia, syncope, fever of unknown origin, nephrolithiasis.   SOCIAL HISTORY:  He is married for over 30 years. He has several children. He is from [place] originally.  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  He never smoked, never drank.  His hobby is riding [place] and he was recently in [place], but his symptoms  had begun before that.  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 His only other travel was to [facility] in 06/2013.   FAMILY HISTORY: Positive for coronary artery disease in his father.   ALLERGIES: No known drug allergies.   REVIEW OF SYSTEMS:  Eleven systems reviewed and negative except as per history of present illness.   MEDICATIONS: Include: 1.  Androgel, which he has stopped.  2.  Aspirin 81 once a day.  3.  Vitamin D-3, 2000 units once a day.  4.  Depo-Testosterone, which he has stopped.  5.  Fexofenadine 180 once a day.  6.  Fluticasone 50 mcg as needed.  7.  Levofloxacin which he finished 2 days ago.  8.  Losartan hydrochlorothiazide 50/12.5 once a day.  9.  Meloxicam 15 mg once a day.  10. Omeprazole 20 mg twice a day.  11. Imitrex 100 mg as needed.   PHYSICAL EXAMINATION:  VITAL SIGNS: Temperature 98.2, pulse 99, blood pressure 128/75, respirations 18, sat 98%.  GENERAL: He is pleasant, interactive, in no acute distress.  HEENT: Pupils equal, round, and reactive to light and accommodation. Extraocular movements are intact. Sclerae anicteric. Oropharynx clear.  NECK: Supple.  HEART: Regular with no murmurs.  LUNGS: Clear.  ABDOMEN: Soft, nontender, nondistended. No hepatosplenomegaly.  EXTREMITIES: No clubbing, cyanosis or edema.  SKIN:  He does have some splinter hemorrhages  under his nail beds.    NEUROLOGIC: He is alert and oriented x 3. Grossly nonfocal neuro exam.   DIAGNOSTIC DATA: His white blood count on 10/20 was 7.1, hemoglobin 13.9, platelets 193,000.  Differential showed neutrophils of 74.1%. Comprehensive metabolic panel on 10/20 was normal with a renal function creatinine of 0.8. TSH was normal. Sedimentation rate was 35.  Prior cultures include blood cultures 06/23 which were negative. Urine culture 06/23 with greater than 100,000 E. coli with a urinalysis with 121 white cells.  He had a negative Clostridium difficile in June of 2015.  Urine culture  09/2013 at greater than 100,000.  Enterococcus and a urinalysis with 18 white cells.   IMAGING:  CT abdomen and pelvis 10/15 showed bilateral nonobstructing renal calculi measuring up to 4 mm. There were bilateral renal cysts measuring up to 1.7 which were benign appearing. There was colonic diverticulosis without diverticulitis. He has had multiple normal chest x-rays.  His HIV test was negative.  Blood cultures are pending from 10/21 done in clinic. Hepatitis C antibody level is pending. [facility], Lyme antibody is negative.   IMPRESSION:  A 54 year old gentleman who has had a fever of unknown origin for the last 3 months.  He had an echocardiogram done by Dr. Darrold JunkerParaschos yesterday that shows concern for an aortic valvular vegetation.  I have a high suspicion for endocarditis given his recent enterococcal urinary tract infection. Other testing has been negative.   PLAN:  1.  Admit to telemetry.  2.  Repeat blood cultures.  3.  Continue home medications.  4.  Pending blood culture results, we will consider starting on IV antibiotics.  5.  If blood cultures are negative, he would need a TEE to further evaluate the aortic valve.     ____________________________ Stann Mainlandavid P. Sampson GoonFitzgerald, MD dpf:DT D: 11/03/2013 11:20:04 ET T: 11/03/2013 13:21:43 ET JOB#: [id number]  cc: Stann Mainlandavid P. Sampson GoonFitzgerald, MD, <Dictator> Cheyne Bungert Sampson GoonFITZGERALD MD ELECTRONICALLY SIGNED 11/08/2013 9:55

## 2014-05-15 ENCOUNTER — Other Ambulatory Visit: Payer: Self-pay | Admitting: Orthopedic Surgery

## 2014-05-15 DIAGNOSIS — M25511 Pain in right shoulder: Secondary | ICD-10-CM

## 2014-05-24 ENCOUNTER — Ambulatory Visit
Admission: RE | Admit: 2014-05-24 | Discharge: 2014-05-24 | Disposition: A | Discharge status: Home / Self Care | Payer: BLUE CROSS/BLUE SHIELD | Source: Ambulatory Visit | Attending: Orthopedic Surgery | Admitting: Orthopedic Surgery

## 2014-05-24 DIAGNOSIS — M25511 Pain in right shoulder: Secondary | ICD-10-CM

## 2014-06-06 ENCOUNTER — Ambulatory Visit
Admission: RE | Admit: 2014-06-06 | Discharge: 2014-06-06 | Disposition: A | Discharge status: Home / Self Care | Payer: BLUE CROSS/BLUE SHIELD | Source: Ambulatory Visit | Attending: Orthopedic Surgery | Admitting: Orthopedic Surgery

## 2014-06-06 DIAGNOSIS — M75121 Complete rotator cuff tear or rupture of right shoulder, not specified as traumatic: Secondary | ICD-10-CM | POA: Insufficient documentation

## 2014-06-06 DIAGNOSIS — M25511 Pain in right shoulder: Secondary | ICD-10-CM | POA: Diagnosis present

## 2014-06-06 DIAGNOSIS — M25411 Effusion, right shoulder: Secondary | ICD-10-CM | POA: Insufficient documentation

## 2014-06-26 ENCOUNTER — Telehealth: Payer: Self-pay

## 2014-06-26 NOTE — Telephone Encounter (Signed)
Refill request from [place] for sumatriptan succ 100 mg, 9 tablets, take 1 tablet at onset of headache, may repeat once in 24 hours.  Please escribe.

## 2014-06-27 MED ORDER — SUMATRIPTAN SUCCINATE 100 MG PO TABS: 100.0000 mg | ORAL_TABLET | ORAL | Status: DC | PRN

## 2014-08-29 ENCOUNTER — Other Ambulatory Visit: Payer: BLUE CROSS/BLUE SHIELD

## 2014-09-04 ENCOUNTER — Encounter
Admission: RE | Admit: 2014-09-04 | Discharge: 2014-09-04 | Disposition: A | Discharge status: Home / Self Care | Payer: BLUE CROSS/BLUE SHIELD | Source: Ambulatory Visit | Attending: Orthopedic Surgery | Admitting: Orthopedic Surgery

## 2014-09-04 ENCOUNTER — Encounter: Payer: Self-pay | Admitting: *Deleted

## 2014-09-04 DIAGNOSIS — Z7982 Long term (current) use of aspirin: Secondary | ICD-10-CM | POA: Diagnosis not present

## 2014-09-04 DIAGNOSIS — F410 Panic disorder [episodic paroxysmal anxiety] without agoraphobia: Secondary | ICD-10-CM | POA: Diagnosis not present

## 2014-09-04 DIAGNOSIS — Y990 Civilian activity done for income or pay: Secondary | ICD-10-CM | POA: Diagnosis not present

## 2014-09-04 DIAGNOSIS — S46011A Strain of muscle(s) and tendon(s) of the rotator cuff of right shoulder, initial encounter: Secondary | ICD-10-CM | POA: Diagnosis present

## 2014-09-04 DIAGNOSIS — W1830XA Fall on same level, unspecified, initial encounter: Secondary | ICD-10-CM | POA: Diagnosis not present

## 2014-09-04 DIAGNOSIS — J310 Chronic rhinitis: Secondary | ICD-10-CM | POA: Diagnosis not present

## 2014-09-04 DIAGNOSIS — M94211 Chondromalacia, right shoulder: Secondary | ICD-10-CM | POA: Diagnosis not present

## 2014-09-04 DIAGNOSIS — G43909 Migraine, unspecified, not intractable, without status migrainosus: Secondary | ICD-10-CM | POA: Diagnosis not present

## 2014-09-04 DIAGNOSIS — Y9289 Other specified places as the place of occurrence of the external cause: Secondary | ICD-10-CM | POA: Diagnosis not present

## 2014-09-04 DIAGNOSIS — Z79899 Other long term (current) drug therapy: Secondary | ICD-10-CM | POA: Diagnosis not present

## 2014-09-04 DIAGNOSIS — S46211A Strain of muscle, fascia and tendon of other parts of biceps, right arm, initial encounter: Secondary | ICD-10-CM | POA: Diagnosis not present

## 2014-09-04 DIAGNOSIS — Y93E5 Activity, floor mopping and cleaning: Secondary | ICD-10-CM | POA: Diagnosis not present

## 2014-09-04 DIAGNOSIS — I10 Essential (primary) hypertension: Secondary | ICD-10-CM | POA: Diagnosis not present

## 2014-09-04 NOTE — Patient Instructions (Signed)
  Your procedure is scheduled on: 09/06/14 Report to same day surgery  To find out your arrival time please call [phone] between 1PM - 3PM on 09/05/14.  Remember: Instructions that are not followed completely may result in serious medical risk, up to and including death, or upon the discretion of your surgeon and anesthesiologist your surgery may need to be rescheduled.    __x__ 1. Do not eat food or drink liquids after midnight. No gum chewing or hard candies.     __x__ 2. No Alcohol for 24 hours before or after surgery.   ____ 3. Bring all medications with you on the day of surgery if instructed.    ____ 4. Notify your doctor if there is any change in your medical condition     (cold, fever, infections).     Do not wear jewelry, make-up, hairpins, clips or nail polish.  Do not wear lotions, powders, or perfumes. You may wear deodorant.  Do not shave 48 hours prior to surgery. Men may shave face and neck.  Do not bring valuables to the hospital.    [facility] is not responsible for any belongings or valuables.               Contacts, dentures or bridgework may not be worn into surgery.  Leave your suitcase in the car. After surgery it may be brought to your room.  For patients admitted to the hospital, discharge time is determined by your                treatment team.   Patients discharged the day of surgery will not be allowed to drive home.   Please read over the following fact sheets that you were given:   Surgical Site Infection Prevention   ____ Take these medicines the morning of surgery with A SIP OF WATER:    1.   2.   3.   4.  5.  6.  ____ Fleet Enema (as directed)   ____ Use CHG Soap as directed  ____ Use inhalers on the day of surgery  ____ Stop metformin 2 days prior to surgery    ____ Take 1/2 of usual insulin dose the night before surgery and none on the morning of surgery.   ____ Stop Coumadin/Plavix/aspirin on   __x__ Stop  Anti-inflammatories on aspirin   __x__ Stop supplements until after surgery fish oil  ____ Bring C-Pap to the hospital.

## 2014-09-05 ENCOUNTER — Ambulatory Visit
Admission: RE | Admit: 2014-09-05 | Discharge: 2014-09-05 | Disposition: A | Discharge status: Home / Self Care | Payer: BLUE CROSS/BLUE SHIELD | Source: Ambulatory Visit | Attending: Orthopedic Surgery | Admitting: Orthopedic Surgery

## 2014-09-05 ENCOUNTER — Encounter
Admission: RE | Admit: 2014-09-05 | Discharge: 2014-09-05 | Disposition: A | Discharge status: Home / Self Care | Payer: BLUE CROSS/BLUE SHIELD | Source: Ambulatory Visit | Attending: Orthopedic Surgery | Admitting: Orthopedic Surgery

## 2014-09-05 DIAGNOSIS — Z01818 Encounter for other preprocedural examination: Secondary | ICD-10-CM | POA: Insufficient documentation

## 2014-09-05 DIAGNOSIS — I10 Essential (primary) hypertension: Secondary | ICD-10-CM

## 2014-09-05 LAB — BASIC METABOLIC PANEL
ANION GAP: 8 (ref 5–15)
BUN: 21 mg/dL — AB (ref 6–20)
CALCIUM: 9.7 mg/dL (ref 8.9–10.3)
CO2: 29 mmol/L (ref 22–32)
Chloride: 103 mmol/L (ref 101–111)
Creatinine, Ser: 0.81 mg/dL (ref 0.61–1.24)
GFR calc Af Amer: >60 mL/min (ref >60)
GLUCOSE: 99 mg/dL (ref 65–99)
POTASSIUM: 4 mmol/L (ref 3.5–5.1)
SODIUM: 140 mmol/L (ref 135–145)

## 2014-09-05 LAB — CBC
HCT: 44.8 % (ref 40.0–52.0)
Hemoglobin: 15.4 g/dL (ref 13.0–18.0)
MCH: 28.1 pg (ref 26.0–34.0)
MCHC: 34.3 g/dL (ref 32.0–36.0)
MCV: 82.2 fL (ref 80.0–100.0)
PLATELETS: 248 10*3/uL (ref 150–440)
RBC: 5.46 MIL/uL (ref 4.40–5.90)
RDW: 13.4 % (ref 11.5–14.5)
WBC: 6.3 10*3/uL (ref 3.8–10.6)

## 2014-09-05 LAB — URINALYSIS COMPLETE WITH MICROSCOPIC (ARMC ONLY)
BACTERIA UA: NONE SEEN
Bilirubin Urine: NEGATIVE
Glucose, UA: NEGATIVE mg/dL
HGB URINE DIPSTICK: NEGATIVE
Ketones, ur: NEGATIVE mg/dL
Nitrite: NEGATIVE
PH: 5 (ref 5.0–8.0)
PROTEIN: NEGATIVE mg/dL
Specific Gravity, Urine: 1.023 (ref 1.005–1.030)

## 2014-09-05 LAB — PROTIME-INR
INR: 0.91
PROTHROMBIN TIME: 12.5 s (ref 11.4–15.0)

## 2014-09-05 LAB — APTT: aPTT: 30 seconds (ref 24–36)

## 2014-09-06 ENCOUNTER — Ambulatory Visit
Admission: RE | Admit: 2014-09-06 | Discharge: 2014-09-06 | Disposition: A | Discharge status: Home / Self Care | Payer: BLUE CROSS/BLUE SHIELD | Source: Ambulatory Visit | Attending: Orthopedic Surgery | Admitting: Orthopedic Surgery

## 2014-09-06 ENCOUNTER — Ambulatory Visit: Payer: BLUE CROSS/BLUE SHIELD | Admitting: Certified Registered"

## 2014-09-06 ENCOUNTER — Encounter: Payer: Self-pay | Admitting: *Deleted

## 2014-09-06 ENCOUNTER — Encounter
Admission: RE | Disposition: A | Discharge status: Home / Self Care | Payer: Self-pay | Source: Ambulatory Visit | Attending: Orthopedic Surgery

## 2014-09-06 DIAGNOSIS — F410 Panic disorder [episodic paroxysmal anxiety] without agoraphobia: Secondary | ICD-10-CM | POA: Insufficient documentation

## 2014-09-06 DIAGNOSIS — J310 Chronic rhinitis: Secondary | ICD-10-CM | POA: Insufficient documentation

## 2014-09-06 DIAGNOSIS — S46211A Strain of muscle, fascia and tendon of other parts of biceps, right arm, initial encounter: Secondary | ICD-10-CM | POA: Insufficient documentation

## 2014-09-06 DIAGNOSIS — G43909 Migraine, unspecified, not intractable, without status migrainosus: Secondary | ICD-10-CM | POA: Insufficient documentation

## 2014-09-06 DIAGNOSIS — M94211 Chondromalacia, right shoulder: Secondary | ICD-10-CM | POA: Insufficient documentation

## 2014-09-06 DIAGNOSIS — Y9289 Other specified places as the place of occurrence of the external cause: Secondary | ICD-10-CM | POA: Insufficient documentation

## 2014-09-06 DIAGNOSIS — Y990 Civilian activity done for income or pay: Secondary | ICD-10-CM | POA: Insufficient documentation

## 2014-09-06 DIAGNOSIS — Y93E5 Activity, floor mopping and cleaning: Secondary | ICD-10-CM | POA: Insufficient documentation

## 2014-09-06 DIAGNOSIS — I10 Essential (primary) hypertension: Secondary | ICD-10-CM | POA: Insufficient documentation

## 2014-09-06 DIAGNOSIS — S46011A Strain of muscle(s) and tendon(s) of the rotator cuff of right shoulder, initial encounter: Secondary | ICD-10-CM | POA: Insufficient documentation

## 2014-09-06 DIAGNOSIS — Z7982 Long term (current) use of aspirin: Secondary | ICD-10-CM | POA: Insufficient documentation

## 2014-09-06 DIAGNOSIS — Z79899 Other long term (current) drug therapy: Secondary | ICD-10-CM | POA: Insufficient documentation

## 2014-09-06 DIAGNOSIS — W1830XA Fall on same level, unspecified, initial encounter: Secondary | ICD-10-CM | POA: Insufficient documentation

## 2014-09-06 HISTORY — DX: Headache, unspecified: R51.9

## 2014-09-06 HISTORY — PX: SHOULDER ARTHROSCOPY WITH OPEN ROTATOR CUFF REPAIR: SHX6092

## 2014-09-06 HISTORY — DX: Headache: R51

## 2014-09-06 HISTORY — DX: Essential (primary) hypertension: I10

## 2014-09-06 HISTORY — DX: Anxiety disorder, unspecified: F41.9

## 2014-09-06 HISTORY — DX: Endocarditis, valve unspecified: I38

## 2014-09-06 HISTORY — DX: Calculus of kidney: N20.0

## 2014-09-06 SURGERY — ARTHROSCOPY, SHOULDER WITH REPAIR, ROTATOR CUFF, OPEN
Anesthesia: General | Site: Shoulder | Laterality: Right | Wound class: Clean

## 2014-09-06 MED ORDER — PROPOFOL 10 MG/ML IV BOLUS
INTRAVENOUS | Status: DC | PRN
  Administered 2014-09-06: 25 mg via INTRAVENOUS
  Administered 2014-09-06: 225 mg via INTRAVENOUS

## 2014-09-06 MED ORDER — EPINEPHRINE HCL 1 MG/ML IJ SOLN
INTRAMUSCULAR | Status: AC
  Filled 2014-09-06: qty 1

## 2014-09-06 MED ORDER — ATROPINE SULFATE 1 MG/ML IJ SOLN
INTRAMUSCULAR | Status: DC | PRN
  Administered 2014-09-06: 1 mg via INTRAVENOUS

## 2014-09-06 MED ORDER — ONDANSETRON HCL 4 MG/2ML IJ SOLN: 4.0000 mg | Freq: Once | INTRAMUSCULAR | Status: DC | PRN

## 2014-09-06 MED ORDER — PROMETHAZINE HCL 12.5 MG PO TABS: 12.5000 mg | ORAL_TABLET | ORAL | Status: DC | PRN

## 2014-09-06 MED ORDER — NEOMYCIN-POLYMYXIN B GU 40-200000 IR SOLN
Status: AC
Start: 2014-09-06 — End: 2014-09-06
  Filled 2014-09-06: qty 2

## 2014-09-06 MED ORDER — FENTANYL CITRATE (PF) 100 MCG/2ML IJ SOLN
INTRAMUSCULAR | Status: DC | PRN
  Administered 2014-09-06: 100 ug via INTRAVENOUS
  Administered 2014-09-06: 50 ug via INTRAVENOUS

## 2014-09-06 MED ORDER — MIDAZOLAM HCL 5 MG/5ML IJ SOLN
INTRAMUSCULAR | Status: DC | PRN
  Administered 2014-09-06: 2 mg via INTRAVENOUS

## 2014-09-06 MED ORDER — CEFAZOLIN SODIUM-DEXTROSE 2-3 GM-% IV SOLR
INTRAVENOUS | Status: AC
  Filled 2014-09-06: qty 50

## 2014-09-06 MED ORDER — CEFAZOLIN SODIUM-DEXTROSE 2-3 GM-% IV SOLR
2.0000 g | Freq: Once | INTRAVENOUS | Status: AC
  Administered 2014-09-06: 2 g via INTRAVENOUS

## 2014-09-06 MED ORDER — ONDANSETRON HCL 4 MG/2ML IJ SOLN
INTRAMUSCULAR | Status: DC | PRN
  Administered 2014-09-06: 4 mg via INTRAVENOUS

## 2014-09-06 MED ORDER — FAMOTIDINE 20 MG PO TABS
20.0000 mg | ORAL_TABLET | Freq: Once | ORAL | Status: AC
  Administered 2014-09-06: 20 mg via ORAL

## 2014-09-06 MED ORDER — LACTATED RINGERS IV SOLN
INTRAVENOUS | Status: DC
  Administered 2014-09-06 (×2): via INTRAVENOUS

## 2014-09-06 MED ORDER — OXYCODONE HCL 5 MG PO TABS: 5.0000 mg | ORAL_TABLET | ORAL | Status: DC | PRN

## 2014-09-06 MED ORDER — LIDOCAINE HCL (PF) 1 % IJ SOLN
INTRAMUSCULAR | Status: AC
  Filled 2014-09-06: qty 30

## 2014-09-06 MED ORDER — ACETAMINOPHEN 10 MG/ML IV SOLN
INTRAVENOUS | Status: AC
  Filled 2014-09-06: qty 100

## 2014-09-06 MED ORDER — BUPIVACAINE HCL (PF) 0.25 % IJ SOLN
INTRAMUSCULAR | Status: AC
Start: 2014-09-06 — End: 2014-09-06
  Filled 2014-09-06: qty 30

## 2014-09-06 MED ORDER — LIDOCAINE HCL (CARDIAC) 20 MG/ML IV SOLN
INTRAVENOUS | Status: DC | PRN
  Administered 2014-09-06: 40 mg via INTRAVENOUS

## 2014-09-06 MED ORDER — LIDOCAINE HCL 1 % IJ SOLN
INTRAMUSCULAR | Status: DC | PRN
  Administered 2014-09-06: 10 mL

## 2014-09-06 MED ORDER — PHENYLEPHRINE HCL 10 MG/ML IJ SOLN
INTRAMUSCULAR | Status: DC | PRN
  Administered 2014-09-06: 100 ug via INTRAVENOUS

## 2014-09-06 MED ORDER — ROCURONIUM BROMIDE 50 MG/5ML IV SOLN
INTRAVENOUS | Status: DC | PRN
  Administered 2014-09-06: 50 mg via INTRAVENOUS

## 2014-09-06 MED ORDER — DEXAMETHASONE SODIUM PHOSPHATE 4 MG/ML IJ SOLN
INTRAMUSCULAR | Status: DC | PRN
  Administered 2014-09-06: 5 mg via INTRAVENOUS

## 2014-09-06 MED ORDER — EPHEDRINE SULFATE 50 MG/ML IJ SOLN
INTRAMUSCULAR | Status: DC | PRN
  Administered 2014-09-06: 10 mg via INTRAVENOUS

## 2014-09-06 MED ORDER — FAMOTIDINE 20 MG PO TABS
ORAL_TABLET | ORAL | Status: AC
  Filled 2014-09-06: qty 1

## 2014-09-06 MED ORDER — FENTANYL CITRATE (PF) 100 MCG/2ML IJ SOLN
INTRAMUSCULAR | Status: AC
  Administered 2014-09-06: 25 ug via INTRAVENOUS
  Filled 2014-09-06: qty 2

## 2014-09-06 MED ORDER — FENTANYL CITRATE (PF) 100 MCG/2ML IJ SOLN
25.0000 ug | INTRAMUSCULAR | Status: DC | PRN
  Administered 2014-09-06 (×4): 25 ug via INTRAVENOUS

## 2014-09-06 MED ORDER — GLYCOPYRROLATE 0.2 MG/ML IJ SOLN
INTRAMUSCULAR | Status: DC | PRN
  Administered 2014-09-06: .3 mg via INTRAVENOUS

## 2014-09-06 SURGICAL SUPPLY — 69 items
ADAPTER IRRIG TUBE 2 SPIKE SOL (ADAPTER) ×6 IMPLANT
ANCHOR DBLROW ROT CUFF 2.8 MAG (Anchor) ×9 IMPLANT
BUR RADIUS 4.0X18.5 (BURR) ×3 IMPLANT
BUR RADIUS 5.5 (BURR) ×3 IMPLANT
CANNULA 5.75X7 CRYSTAL CLEAR (CANNULA) IMPLANT
CANNULA PARTIAL THREAD 2X7 (CANNULA) IMPLANT
CANNULA TWIST IN 8.25X9CM (CANNULA) IMPLANT
CLOSURE WOUND 1/2 X4 (GAUZE/BANDAGES/DRESSINGS) ×1
CONNECTOR M SMARTSTITCH (Connector) ×6 IMPLANT
CONNECTOR PERFECT PASSER (CONNECTOR) ×3 IMPLANT
COOLER POLAR GLACIER W/PUMP (MISCELLANEOUS) ×3 IMPLANT
DECANTER SPIKE VIAL GLASS SM (MISCELLANEOUS) ×3 IMPLANT
DRAPE IMP U-DRAPE 54X76 (DRAPES) ×6 IMPLANT
DRAPE INCISE IOBAN 66X45 STRL (DRAPES) ×3 IMPLANT
DRAPE U-SHAPE 47X51 STRL (DRAPES) ×3 IMPLANT
DURAPREP 26ML APPLICATOR (WOUND CARE) ×9 IMPLANT
GAUZE PETRO XEROFOAM 1X8 (MISCELLANEOUS) ×3 IMPLANT
GAUZE SPONGE 4X4 12PLY STRL (GAUZE/BANDAGES/DRESSINGS) ×6 IMPLANT
GLOVE BIOGEL PI IND STRL 9 (GLOVE) ×1 IMPLANT
GLOVE BIOGEL PI INDICATOR 9 (GLOVE) ×2
GLOVE SURG 9.0 ORTHO LTXF (GLOVE) ×6 IMPLANT
GOWN STRL REUS TWL 2XL XL LVL4 (GOWN DISPOSABLE) ×3 IMPLANT
GOWN STRL REUS W/ TWL LRG LVL3 (GOWN DISPOSABLE) ×1 IMPLANT
GOWN STRL REUS W/ TWL LRG LVL4 (GOWN DISPOSABLE) ×1 IMPLANT
GOWN STRL REUS W/TWL LRG LVL3 (GOWN DISPOSABLE) ×2
GOWN STRL REUS W/TWL LRG LVL4 (GOWN DISPOSABLE) ×2
IV LACTATED RINGER IRRG 3000ML (IV SOLUTION) ×32
IV LR IRRIG 3000ML ARTHROMATIC (IV SOLUTION) ×16 IMPLANT
KIT RM TURNOVER STRD PROC AR (KITS) ×3 IMPLANT
KIT STABILIZATION SHOULDER (MISCELLANEOUS) ×3 IMPLANT
MANIFOLD NEPTUNE II (INSTRUMENTS) ×3 IMPLANT
MASK FACE SPIDER DISP (MASK) ×3 IMPLANT
MAT BLUE FLOOR 46X72 FLO (MISCELLANEOUS) ×3 IMPLANT
NDL SAFETY 18GX1.5 (NEEDLE) ×3 IMPLANT
NDL SAFETY 22GX1.5 (NEEDLE) ×3 IMPLANT
NS IRRIG 500ML POUR BTL (IV SOLUTION) ×3 IMPLANT
PACK ARTHROSCOPY SHOULDER (MISCELLANEOUS) ×3 IMPLANT
PAD GROUND ADULT SPLIT (MISCELLANEOUS) ×3 IMPLANT
PAD WRAPON POLAR SHDR UNIV (MISCELLANEOUS) ×1 IMPLANT
PAD WRAPON POLAR SHDR XLG (MISCELLANEOUS) ×1 IMPLANT
PASSER SUT CAPTURE FIRST (SUTURE) ×3 IMPLANT
SET TUBE SUCT SHAVER OUTFL 24K (TUBING) ×3 IMPLANT
SET TUBE TIP INTRA-ARTICULAR (MISCELLANEOUS) ×3 IMPLANT
SLING ULTRA II LG (MISCELLANEOUS) IMPLANT
SLING ULTRA II M (MISCELLANEOUS) IMPLANT
STRIP CLOSURE SKIN 1/2X4 (GAUZE/BANDAGES/DRESSINGS) ×2 IMPLANT
SUT CO BRAID (SUTURE) ×9 IMPLANT
SUT ETHILON 4-0 (SUTURE) ×2
SUT ETHILON 4-0 FS2 18XMFL BLK (SUTURE) ×1
SUT KNTLS 2.8 MAGNUM (Anchor) ×12 IMPLANT
SUT MNCRL 4-0 (SUTURE)
SUT MNCRL 4-0 27XMFL (SUTURE)
SUT PDS AB 0 CT1 27 (SUTURE) IMPLANT
SUT PERFECTPASSER WHITE CART (SUTURE) ×3 IMPLANT
SUT SMART STITCH CARTRIDGE (SUTURE) ×3 IMPLANT
SUT VIC AB 0 CT1 36 (SUTURE) ×3 IMPLANT
SUT VIC AB 2-0 CT2 27 (SUTURE) ×3 IMPLANT
SUTURE ETHLN 4-0 FS2 18XMF BLK (SUTURE) ×1 IMPLANT
SUTURE MAGNUM WIRE 2X48 BLK (SUTURE) IMPLANT
SUTURE MNCRL 4-0 27XMF (SUTURE) IMPLANT
SUTURE OPUS MAGNUM SZ 2 WHT (SUTURE) ×9 IMPLANT
SYRINGE 10CC LL (SYRINGE) ×3 IMPLANT
TAPE MICROFOAM 4IN (TAPE) ×3 IMPLANT
TUBING ARTHRO INFLOW-ONLY STRL (TUBING) ×3 IMPLANT
TUBING CONNECTING 10 (TUBING) ×2 IMPLANT
TUBING CONNECTING 10' (TUBING) ×1
WAND HAND CNTRL MULTIVAC 90 (MISCELLANEOUS) ×3 IMPLANT
WRAPON POLAR PAD SHDR UNIV (MISCELLANEOUS) ×3
WRAPON POLAR PAD SHDR XLG (MISCELLANEOUS) ×3

## 2014-09-06 NOTE — Anesthesia Postprocedure Evaluation (Signed)
  Anesthesia Post-op Note  Patient: Jonathan Carter  Procedure(s) Performed: Procedure(s): SHOULDER ARTHROSCOPY WITH OPEN ROTATOR CUFF REPAIR (Right)  Anesthesia type:General  Patient location: PACU  Post pain: Pain level controlled  Post assessment: Post-op Vital signs reviewed, Patient's Cardiovascular Status Stable, Respiratory Function Stable, Patent Airway and No signs of Nausea or vomiting  Post vital signs: Reviewed and stable  Last Vitals:  Filed Vitals:   09/06/14 1758  BP: 121/71  Pulse: 87  Temp: 36.7 C  Resp: 16    Level of consciousness: awake, alert  and patient cooperative  Complications: No apparent anesthesia complications

## 2014-09-06 NOTE — Transfer of Care (Signed)
Immediate Anesthesia Transfer of Care Note  Patient: Jonathan Carter  Procedure(s) Performed: Procedure(s): SHOULDER ARTHROSCOPY WITH OPEN ROTATOR CUFF REPAIR (Right)  Patient Location: PACU  Anesthesia Type:Regional and Spinal  Level of Consciousness: awake, alert  and oriented  Airway & Oxygen Therapy: Patient Spontanous Breathing and Patient connected to nasal cannula oxygen  Post-op Assessment: Report given to RN and Post -op Vital signs reviewed and stable  Post vital signs: Reviewed and stable  Last Vitals:  Filed Vitals:   09/06/14 1150  BP:   Pulse:   Temp: 35.9 C  Resp:     Complications: No apparent anesthesia complications

## 2014-09-06 NOTE — Anesthesia Preprocedure Evaluation (Addendum)
Anesthesia Evaluation  Patient identified by MRN, date of birth, ID band Patient awake    Reviewed: Allergy & Precautions, NPO status , Patient's Chart, lab work & pertinent test results, reviewed documented beta blocker date and time   Airway Mallampati: II  TM Distance: >3 FB     Dental  (+) Chipped   Pulmonary          Cardiovascular hypertension, Pt. on medications     Neuro/Psych  Headaches, PSYCHIATRIC DISORDERS Anxiety    GI/Hepatic   Endo/Other    Renal/GU Renal disease     Musculoskeletal   Abdominal   Peds  Hematology   Anesthesia Other Findings   Reproductive/Obstetrics                            Anesthesia Physical Anesthesia Plan  ASA: II  Anesthesia Plan: General   Post-op Pain Management: GA combined w/ Regional for post-op pain   Induction: Intravenous  Airway Management Planned: Oral ETT  Additional Equipment:   Intra-op Plan:   Post-operative Plan:   Informed Consent: I have reviewed the patients History and Physical, chart, labs and discussed the procedure including the risks, benefits and alternatives for the proposed anesthesia with the patient or authorized representative who has indicated his/her understanding and acceptance.     Plan Discussed with: CRNA  Anesthesia Plan Comments:         Anesthesia Quick Evaluation

## 2014-09-06 NOTE — Progress Notes (Signed)
Dr Martha Clan in to see patient 1230 - discussed block with patient.   WILL NOT DO BLOCK.   - Dr Maisie Fus and Lawernce Keas, RN notified of same

## 2014-09-06 NOTE — H&P (Signed)
PREOPERATIVE H&P  Chief Complaint: COMPLETE ROTATOR CUFF TEAR RIGHT SHOULDER  HPI: Jonathan Carter is a 54 y.o. 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 Symptoms are rated as moderate to severe, and have been worsening.  This is significantly impairing activities of daily living.  He has elected for surgical management.   Past Medical History  Diagnosis Date  . Endocarditis 2015  . Kidney stones 2015  . Hypertension     no medication  . Anxiety     panic attacks  . Headache     migraines   History reviewed. No pertinent past surgical history. Social History   Social History  . Marital Status: Married    Spouse Name: N/A  . Number of Children: N/A  . Years of Education: N/A   Social History Main Topics  . Smoking status: Never Smoker   . Smokeless tobacco: None  . Alcohol Use: No  . Drug Use: No  . Sexual Activity: Yes    Birth Control/ Protection: None   Other Topics Concern  . None   Social History Narrative   History reviewed. No pertinent family history. No Known Allergies Prior to Admission medications   Medication Sig Start Date End Date Taking? Authorizing Provider  aspirin EC 81 MG tablet Take 81 mg by mouth Nightly.   Yes Historical Provider, MD  Fish Oil-Cholecalciferol (FISH OIL + D3) 1200-1000 MG-UNIT CAPS Take 1 capsule by mouth Nightly.   Yes Historical Provider, MD  FLUoxetine (PROZAC) 20 MG capsule Take 20 mg by mouth at bedtime.   Yes Historical Provider, MD  fluticasone (FLONASE) 50 MCG/ACT nasal spray Place 1 spray into both nostrils daily as needed for allergies or rhinitis.   Yes Historical Provider, MD  Multiple Vitamins-Minerals (CENTRUM SILVER PO) Take 1 tablet by mouth at bedtime.   Yes Historical Provider, MD  SUMAtriptan (IMITREX) 100 MG tablet Take 1 tablet (100 mg total) by mouth every 2 (two) hours as needed  for migraine. May repeat in 2 hours if headache persists or recurs. 06/27/14   Guadalupe Maple, MD     Positive ROS: All other systems have been reviewed and were otherwise negative with the exception of those mentioned in the HPI and as above.  Physical Exam: General: Alert, no acute distress Cardiovascular: Regular rate and rhythm, no murmurs rubs or gallops.  No pedal edema Respiratory: Clear to auscultation bilaterally, no wheezes rales or rhonchi. No cyanosis, no use of accessory musculature GI: No organomegaly, abdomen is soft and non-tender nondistended with positive bowel sounds. Skin: Skin intact, no lesions within the operative field. Neurologic: Sensation intact distally Psychiatric: Patient is competent for consent with normal mood and affect Lymphatic: No axillary or cervical lymphadenopathy  MUSCULOSKELETAL: Right shoulder: Patient's skin is intact. There is no muscle atrophy. He has no asymmetry between the shoulders. He is no erythema or ecchymosis or swelling. His active abduction is limited to  60 and he demonstrates profound weakness to his supraspinatus and external rotators. His forward elevation is limited to approximately 90. He has 5 out of 5 strength of his biceps and triceps to exam. He has positive impingement signs but no apprehension or instability. He has intact sensation light touch throughout the right upper extremity and a palpable radial pulse.  Assessment: COMPLETE ROTATOR CUFF TEAR, RIGHT SHOULDER  Plan: Plan for Procedure(s): SHOULDER ARTHROSCOPY WITH  OPEN ROTATOR CUFF REPAIR  I met with the patient in my office prior to surgery. I have explained to him the details of his operation and postoperative course as well as the risks and benefits. In her symptoms and he'll be in a sling for 4 weeks. His active abduction and forward elevation will be limited for 8 weeks. His full recovery will take approximately 6 months.  I discussed the risks and benefits of  surgery. The risks include but are not limited to infection, bleeding requiring blood transfusion, nerve or blood vessel injury, joint stiffness or loss of motion, persistent pain, weakness or instability, and hardware failure and the need for further surgery. Medical risks include but are not limited to DVT and pulmonary embolism, myocardial infarction, stroke, pneumonia, respiratory failure and death. Patient understood these risks and wished to proceed.   Thornton Park, MD   09/06/2014 12:25 PM

## 2014-09-06 NOTE — Anesthesia Procedure Notes (Signed)
Procedure Name: Intubation Date/Time: 09/06/2014 1:03 PM Performed by: Derinda Late Pre-anesthesia Checklist: Emergency Drugs available, Patient identified, Suction available, Patient being monitored and Timeout performed Patient Re-evaluated:Patient Re-evaluated prior to inductionPreoxygenation: Pre-oxygenation with 100% oxygen Intubation Type: IV induction Ventilation: Mask ventilation without difficulty Laryngoscope Size: 2 and Miller Grade View: Grade I Tube type: Oral

## 2014-09-06 NOTE — Op Note (Signed)
09/06/2014  9:52 PM  PATIENT:  Jonathan Carter  54 y.o. male  PRE-OPERATIVE DIAGNOSIS:  COMPLETE ROTATOR CUFF TEAR, RIGHT SHOULDER  POST-OPERATIVE DIAGNOSIS:  COMPLETE ROTATOR CUFF TEAR, PARTIAL TEAR OF BICEPS TENDON AND SUBSCAPULARIS, OSTEOARTHRITIS OF THE GLENOHUMERAL JOINT  PROCEDURE:  Procedure(s): RIGHT SHOULDER ARTHROSCOPY WITH MINI-OPEN ROTATOR CUFF REPAIR, ARTHROSCOPIC SUBACROMIAL DECOMPRESSION AND MINI-OP BICEPS TENODESIS  SURGEON:  Surgeon(s) and Role:    * Thornton Park, MD - Primary  ANESTHESIA:  GENERAL, local anesthesia with 1% lidocaine plain and 0.25% Marcaine plain  PREOPERATIVE INDICATIONS:  Jonathan Carter is a  54 y.o. male with a diagnosis of COMPLETE ROTATOR CUFF TEAR involving the supra and infraspinatus, confirmed by MRI, who failed conservative measures and elected for surgical management.  I discussed the details of surgery as well as the postoperative course with him prior to the date of surgery my office. I also explained to him the risks and benefits of surgery preoperatively including but not limited to the risks of infection, bleeding, nerve injury, persistent pain or weakness, failure of the hardware, re-tear of the rotator cuff and the need for further surgery. Medical risks include, but are not limited to DVT and pulmonary embolism, myocardial infarction, stroke, pneumonia, respiratory failure and death. Patient understood these risks and wished to proceed.  OPERATIVE IMPLANTS: ArthroCare Magnum 2 anchors 5  OPERATIVE FINDINGS: Full-thickness tear of the rotator cuff involving the supra and infraspinatus with retraction, partial tear of the biceps tendon with medial subluxation, partial tear of the superior fibers of the subscapularis without retraction. 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  OPERATIVE PROCEDURE: The patient was met in the preoperative area. The operative shoulder was signed  with the word yes and my initials according the hospital's correct site of surgery protocol. He was brought to the operating room where he underwent general endotracheal intubation. The patient was placed in a beachchair position. A spider arm positioner was used for this case. Examination under anesthesia revealed no limitation of motion or instability with load and shift testing. The patient had a negative sulcus sign.  The patient was prepped and draped in a sterile fashion. A timeout was performed to verify the patient's name, date of birth, medical record number, correct site of surgery and correct procedure to be performed there was also used to verify the patient received antibiotics that all appropriate instruments, implants and radiographs studies were available in the room. Once all in attendance were in agreement case began.  Bony landmarks were drawn out with a surgical marker along with proposed arthroscopy incisions. These were pre-injected with 1% lidocaine plain. An 11 blade was used to establish a posterior portal through which the arthroscope was placed in the glenohumeral joint. A full diagnostic examination of the shoulder was performed. the anterior portal was established under direct visualization with an 18-gauge spinal needle. A 5.75 the medial arthroscopic cannula was placed through this anterior portal.   The intra-articular portion of the biceps tendon was found to have a partial tear involving greater than 50% of the diameter. Therefore the decision was made to perform a tenodesis. An Community education officer stitch was placed in the biceps tendon and a tenotomy was performed using an arthroscopic scissor through the anterior cannula. The suture was then brought out through the lateral portal for later mini open tenodesis.   The arthroscope was then placed in the subacromial space. A lateral portal was then established using an 18-gauge spinal  needle for localization. The arthroscope  was then placed in the subacromial space. Extensive bursitis was encountered and debrided using a 4-0 resector shaver blade and a 90 ArthroCare wand from the lateral portal. A subacromial decompression was also performed using a 5.5 mm resector shaver blade from the lateral portal.The arthroscope was then placed through the lateral portal. The rotator cuff tear was determined to be a V-shaped with the tear extending between the supra and infraspinatus. Therefore 3 ArthroCare Smart stitches were placed in a side-to-side fashion to allow for marginal convergence. 4 ArthroCare Smart stitches were then placed in the lateral border of the rotator cuff tear.  The greater tuberosity was debrided using a 5.5 mm resector shaver blade to remove all remaining torn fibers of the rotator cuff.  Debridement was performed until punctate bleeding was seen at the greater tuberosity footprint to facilitate for rotator cuff tendon to bone healing.   All arthroscopic instruments were then removed and the mini-open portion of the procedure began.   A saber-type incision was made along the lateral border of the acromion. The deltoid muscle was identified and split in line with its fibers which allowed visualization of the rotator cuff. The biceps tendon and its associated tagging suture were brought out through the deltoid split. The Smart stitches previously placed in the lateral border of the rotator cuff were also brought out through the deltoid split.   A tonsil clamp was placed on the biceps tendon approximate 15 mm from the end. The intra-articular biceps portion was then resected. A new ArthroCare Smart stitch was placed at the end of the biceps tendon in a lasso type fashion. A tenodesis was performed placing the biceps tendon at the top of the bicipital groove with a single Magnum 2 anchor.   The Smart stitches from the lateral border of the rotator cuff were then anchored to thelateral portion of the humeral  head using 4 Magnum 2 anchors. These anchors were tensioned to allow for anatomic reduction of the rotator cuff to the greater tuberosity footprint. Once all sutures were tied down, arthroscopic images of therotator cuff repair were taken with the arthroscope both externally and arthroscopically fromthe glenohumeral joint.  All incisions were copiously irrigated. The deltoid fascia was repaired using a 0 Vicryl suturean interrupted fashion. . The subcutaneous tissue of all incisions were closed with a 2-0 Vicryl. Skin closure for the arthroscopic incisions was performed with 4-0 nylon. The skin edges of the saber incision were approximated with a running 4-0 undyed Monocryl. 0.25% Marcaine plain was then injected into the subacromial space at the conclusion of the case to assist with postop pain control. A dry sterile dressing was applied. The patient was placed in an abduction sling, with a Polar Care sleeve.  All sharp and instrument counts were correct at the conclusion of the case. I was scrubbed and present for the entire case. I spoke with the patient's wife postoperatively by phone to let her know the case had gone without complication and the patient was stable in recovery room.    Timoteo Gaul, MD

## 2014-09-06 NOTE — Anesthesia Postprocedure Evaluation (Signed)
  Anesthesia Post-op Note  Patient: Jonathan Carter  Procedure(s) Performed: Procedure(s): SHOULDER ARTHROSCOPY WITH OPEN ROTATOR CUFF REPAIR (Right)  Anesthesia type:General  Patient location: PACU  Post pain: Pain level controlled  Post assessment: Post-op Vital signs reviewed, Patient's Cardiovascular Status Stable, Respiratory Function Stable, Patent Airway and No signs of Nausea or vomiting  Post vital signs: Reviewed and stable  Last Vitals:  Filed Vitals:   09/06/14 1720  BP: 134/73  Pulse: 83  Temp:   Resp: 14    Level of consciousness: awake, alert  and patient cooperative  Complications: No apparent anesthesia complications

## 2014-09-06 NOTE — Discharge Instructions (Addendum)
Wear sling at all times, including sleep.  You will need to use the sling for a total of 4 weeks following surgery.  Do not try and lift your arm away from your body for any reason.   Keep the dressing dry.  You may remove bandage in 3 days.  Leave the Steri-Strips (white medical tape) in place.  You may place additional Band-Aids over top of the Steri-Strips if you wish.  May shower once dressing is removed in 3 days.  Remove sling carefully only for showers, leaving arm down by your side while in the shower.  Make sure to take some pain medication this evening before you fall asleep, in preparation for the nerve block wearing off in the middle of the night.  If the the pain medication causes itching, or is too strong, try taking a single tablet at a time, or combining with Benadryl.  You may be most comfortable sleeping in a recliner.  If you do sleep in near bed, placed pillows behind the shoulder that have the operation to support it.Surgery for Rotator Cuff Tear with Rehab Rotator cuff surgery is only recommended for individuals who have experienced persistent disability for greater than 3 months of non-surgical (conservative) treatment. Surgery is not necessary but is recommended for individuals who experience difficulty completing daily activities or athletes who are unable to compete. Rotator cuff tears do not usually heal without surgical intervention. If left alone small rotator cuff tears usually become larger. Younger athletes who have a rotator cuff tear may be recommended for surgery without attempting conservative rehabilitation. The purpose of surgery is to regain function of the shoulder joint and eliminate pain associated with the injury. In addition to repairing the tendon tear, the surgery often removes a portion of the bony roof of the shoulder (acromion) as well as the chronically thickened and inflamed membrane below the acromion (subacromial bursa). REASONS NOT TO OPERATE    Infection of the shoulder.  Inability to complete a rehabilitation program.  Patients who have other conditions (emotional or psychological) conditions that contribute to their shoulder condition. RISKS AND COMPLICATIONS  Infection.  Re-tear of the rotator cuff tendons or muscles.  Shoulder stiffness and/or weakness.  Inability to compete in athletics.  Acromioclavicular (AC) joint paint.  Risks of surgery: infection, bleeding, nerve damage, or damage to surrounding tissues. TECHNIQUE There are different surgical procedures used to treat rotator cuff tears. The type of procedure depends on the extent of injury as well as the surgeon's preference. All of the surgical techniques for rotator cuff tears have the same goal of repairing the torn tendon, removing part of the acromion, and removing the subacromial bursa. There are two main types of procedures: arthroscopic and open incision. Arthroscopic procedures are usually completed and you go home the same day as surgery (outpatient). These procedures use multiple small incisions in which tools and a video camera are placed to work on the shoulder. An electric shaver removes the bursa, then a power burr shaves down the portion of the acromion that places pressure on the rotator cuff. Finally the rotator cuff is sewed (sutured) back to the humeral head. Open incision procedures require a larger incision. The deltoid muscle is detached from the acromion and a ligament in the shoulder (coracoacromial) is cut in order for the surgeon to access the rotator cuff. The subacromial bursa is removed as well as part of the acromion to give the rotator cuff room to move freely. The torn tendon is then  sutured to the humeral head. After the rotator cuff is repaired, then the deltoid is reattached and the incision is closed up.  RECOVERY   Post-operative care depends on the surgical technique and the preferences of your therapist.  Keep the wound clean  and dry for the first 10 to 14 days after surgery.  Keep your shoulder and arm in the sling provided to you for as long as you have been instructed to.  You will be given pain medications by your caregiver.  Passive (without using muscles) shoulder movements may be begun immediately after surgery.  It is important to follow through with you rehabilitation program in order to have the best possible recovery. RETURN TO SPORTS   The rehabilitation period will depend on the sport and position you play as well as the success of the operation.  The minimum recovery period is 6 months.  You must have regained complete shoulder motion and strength before returning to sports. SEEK IMMEDIATE MEDICAL CARE IF:   Any medications produce adverse side effects.  Any complications from surgery occur:  Pain, numbness, or coldness in the extremity operated upon.  Discoloration of the nail beds (they become blue or gray) of the extremity operated upon.  Signs of infections (fever, pain, inflammation, redness, or persistent bleeding). EXERCISES  RANGE OF MOTION (ROM) AND STRETCHING EXERCISES - Rotator Cuff Tear, Surgery For These exercises may help you restore your elbow mobility once your physician has discontinued your immobilization period. Beginning these before your provider's approval may result in delayed healing. Your symptoms may resolve with or without further involvement from your physician, physical therapist or athletic trainer. While completing these exercises, remember:   Restoring tissue flexibility helps normal motion to return to the joints. This allows healthier, less painful movement and activity.  An effective stretch should be held for at least 30 seconds. A stretch should never be painful. You should only feel a gentle lengthening or release in the stretch. ROM - Pendulum   Bend at the waist so that your right / left arm falls away from your body. Support yourself with your  opposite hand on a solid surface, such as a table or a countertop.  Your right / left arm should be perpendicular to the ground. If it is not perpendicular, you need to lean over farther. Relax the muscles in your right / left arm and shoulder as much as possible.  Gently sway your hips and trunk so they move your right / left arm without any use of your right / left shoulder muscles.  Progress your movements so that your right / left arm moves side to side, then forward and backward, and finally, both clockwise and counterclockwise.  Complete __________ repetitions in each direction. Many people use this exercise to relieve discomfort in their shoulder as well as to gain range of motion. Repeat __________ times. Complete this exercise __________ times per day. STRETCH - Flexion, Seated   Sit in a firm chair so that your right / left forearm can rest on a table or on a table or countertop. Your right / left elbow should rest below the height of your shoulder so that your shoulder feels supported and not tense or uncomfortable.  Keeping your right / left shoulder relaxed, lean forward at your waist, allowing your right / left hand to slide forward. Bend forward until you feel a moderate stretch in your shoulder, but before you feel an increase in your pain.  Hold __________ seconds.  Slowly return to your starting position. Repeat __________ times. Complete this exercise __________ times per day.  STRETCH - Flexion, Standing   Stand with good posture. With an underhand grip on your right / left and an overhand grip on the opposite hand, grasp a broomstick or cane so that your hands are a little more than shoulder-width apart.  Keeping your right / left elbow straight and shoulder muscles relaxed, push the stick with your opposite hand to raise your right / left arm in front of your body and then overhead. Raise your arm until you feel a stretch in your right / left shoulder, but before you have  increased shoulder pain.  Try to avoid shrugging your right / left shoulder as your arm rises by keeping your shoulder blade tucked down and toward your mid-back spine. Hold __________ seconds.  Slowly return to the starting position. Repeat __________ times. Complete this exercise __________ times per day.  STRETCH - Abduction, Supine   Stand with good posture. With an underhand grip on your right / left and an overhand grip on the opposite hand, grasp a broomstick or cane so that your hands are a little more than shoulder-width apart.  Keeping your right / left elbow straight and shoulder muscles relaxed, push the stick with your opposite hand to raise your right / left arm out to the side of your body and then overhead. Raise your arm until you feel a stretch in your right / left shoulder, but before you have increased shoulder pain.  Try to avoid shrugging your right / left shoulder as your arm rises by keeping your shoulder blade tucked down and toward your mid-back spine. Hold __________ seconds.  Slowly return to the starting position. Repeat __________ times. Complete this exercise __________ times per day.  ROM - Flexion, Active-Assisted  Lie on your back. You may bend your knees for comfort.  Grasp a broomstick or cane so your hands are about shoulder-width apart. Your right / left hand should grip the end of the stick/cane so that your hand is positioned "thumbs-up," as if you were about to shake hands.  Using your healthy arm to lead, raise your right / left arm overhead until you feel a gentle stretch in your shoulder. Hold __________ seconds.  Use the stick/cane to assist in returning your right / left arm to its starting position. Repeat __________ times. Complete this exercise __________ times per day.  STRETCH - External Rotation   Tuck a folded towel or small ball under your right / left upper arm. Grasp a broomstick or cane with an underhand grasp a little more than  shoulder width apart. Bend your elbows to 90 degrees.  Stand with good posture or sit in a chair without arms.  Use your strong arm to push the stick across your body. Do not allow the towel or ball to fall. This will rotate your right / left arm away from your abdomen. Using the stick turn/rotate your hand and forearm away from your body. Hold __________ seconds. Repeat __________ times. Complete this exercise __________ times per day.  STRENGTHENING EXERCISES - Rotator Cuff Tear, Surgery For These exercises may help you begin to restore your elbow strength in the initial stage of your rehabilitation. Your physician will determine when you begin these exercises depending on the severity of your injury and the integrity of your repaired tissues. Beginning these before your provider's approval may result in delayed healing. While completing these exercises, remember:  Muscles can gain both the endurance and the strength needed for everyday activities through controlled exercises.  Complete these exercises as instructed by your physician, physical therapist or athletic trainer. Progress the resistance and repetitions only as guided.  You may experience muscle soreness or fatigue, but the pain or discomfort you are trying to eliminate should never worsen during these exercises. If this pain does worsen, stop and make certain you are following the directions exactly. If the pain is still present after adjustments, discontinue the exercise until you can discuss the trouble with your clinician. STRENGTH - Shoulder Flexion, Isometric   With good posture and facing a wall, stand or sit about 4-6 inches away.  Keeping your right / left elbow straight, gently press the top of your fist into the wall. Increase the pressure gradually until you are pressing as hard as you can without shrugging your shoulder or increasing any shoulder discomfort.  Hold __________ seconds. Release the tension slowly. Relax  your shoulder muscles completely before you do the next repetition. Repeat __________ times. Complete this exercise __________ times per day.  STRENGTH - Shoulder Abductors, Isometric   With good posture, stand or sit about 4-6 inches from a wall with your right / left side facing the wall.  Bend your right / left elbow. Gently press your right / left elbow into the wall. Increase the pressure gradually until you are pressing as hard as you can without shrugging your shoulder or increasing any shoulder discomfort.  Hold __________ seconds.  Release the tension slowly. Relax your shoulder muscles completely before you do the next repetition. Repeat __________ times. Complete this exercise __________ times per day.  STRENGTH - Internal Rotators, Isometric   Keep your right / left elbow at your side and bend it 90 degrees.  Step into a door frame so that the inside of your right / left wrist can press against the door frame without your upper arm leaving your side.  Gently press your right / left wrist into the door frame as if you were trying to draw the palm of your hand to your abdomen. Gradually increase the tension until you are pressing as hard as you can without shrugging your shoulder or increasing any shoulder discomfort.  Hold __________ seconds.  Release the tension slowly. Relax your shoulder muscles completely before you do the next repetition. Repeat __________ times. Complete this exercise __________ times per day.  STRENGTH - External Rotators, Isometric   Keep your right / left elbow at your side and bend it 90 degrees.  Step into a door frame so that the outside of your right / left wrist can press against the door frame without your upper arm leaving your side.  Gently press your right / left wrist into the door frame as if you were trying to swing the back of your hand away from your abdomen. Gradually increase the tension until you are pressing as hard as you can  without shrugging your shoulder or increasing any shoulder discomfort.  Hold __________ seconds.  Release the tension slowly. Relax your shoulder muscles completely before you do the next repetition. Repeat __________ times. Complete this exercise __________ times per day.  Document Released: 12/30/2004 Document Revised: 03/24/2011 Document Reviewed: 04/13/2008 [facility] Patient Information 2015 [place], [place]. This information is not intended to replace advice given to you by your health care provider. Make sure you discuss any questions you have with your health care provider. General Anesthesia, Care After Refer to this  sheet in the next few weeks. These instructions provide you with information on caring for yourself after your procedure. Your health care provider may also give you more specific instructions. Your treatment has been planned according to current medical practices, but problems sometimes occur. Call your health care provider if you have any problems or questions after your procedure. WHAT TO EXPECT AFTER THE PROCEDURE After the procedure, it is typical to experience:  Sleepiness.  Nausea and vomiting. HOME CARE INSTRUCTIONS  For the first 24 hours after general anesthesia:  Have a responsible person with you.  Do not drive a car. If you are alone, do not take public transportation.  Do not drink alcohol.  Do not take medicine that has not been prescribed by your health care provider.  Do not sign important papers or make important decisions.  You may resume a normal diet and activities as directed by your health care provider.  Change bandages (dressings) as directed.  If you have questions or problems that seem related to general anesthesia, call the hospital and ask for the anesthetist or anesthesiologist on call. SEEK MEDICAL CARE IF:  You have nausea and vomiting that continue the day after anesthesia.  You develop a rash. SEEK IMMEDIATE MEDICAL CARE IF:    You have difficulty breathing.  You have chest pain.  You have any allergic problems. Document Released: 04/07/2000 Document Revised: 01/04/2013 Document Reviewed: 07/15/2012 [facility] Patient Information 2015 [place], [place]. This information is not intended to replace advice given to you by your health care provider. Make sure you discuss any questions you have with your health care provider.

## 2014-09-06 NOTE — Anesthesia Postprocedure Evaluation (Signed)
  Anesthesia Post-op Note  Patient: Jonathan Carter  Procedure(s) Performed: Procedure(s): SHOULDER ARTHROSCOPY WITH OPEN ROTATOR CUFF REPAIR (Right)  Anesthesia type:General  Patient location: PACU  Post pain: Pain level controlled  Post assessment: Post-op Vital signs reviewed, Patient's Cardiovascular Status Stable, Respiratory Function Stable, Patent Airway and No signs of Nausea or vomiting  Post vital signs: Reviewed and stable  Last Vitals:  Filed Vitals:   09/06/14 1758  BP: 121/71  Pulse: 87  Temp: 36.7 C  Resp: 16    Level of consciousness: awake, alert  and patient cooperative  Complications: No apparent anesthesia complications  

## 2014-09-07 NOTE — Addendum Note (Signed)
Addendum  created 09/07/14 1012 by Stormy Fabian, CRNA   Modules edited: Anesthesia Events, Anesthesia Responsible Staff

## 2014-09-12 ENCOUNTER — Encounter: Payer: Self-pay | Admitting: Orthopedic Surgery

## 2014-12-30 IMAGING — CT CT ABD-PELV W/ CM
2 of 5 series · 15 of 46 positions shown, 17 images · IV contrast (isovue)
Comparison: Renal ultrasound dated 07/05/2013

CLINICAL DATA: Duct fever, recurrent UTI, generalized
abdominopelvic pain. Passed kidney stone two months ago.

EXAM:
CT ABDOMEN AND PELVIS WITH CONTRAST
TECHNIQUE: Multidetector CT imaging of the abdomen and pelvis was performed
using the standard protocol following bolus administration of
intravenous contrast.
CONTRAST:  100 mL Isovue 300 IV

[Series 2: routine abd pel with · axial · 0.68mm/px · z∈[-504,-80]mm · 12 of 95 slices shown, 14 images]
[im 5/95  soft-tissue]
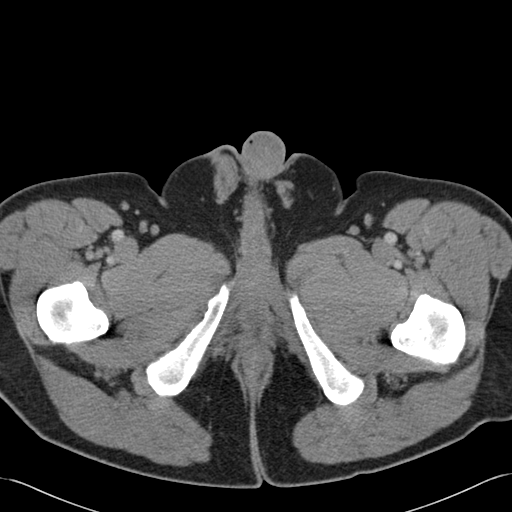
[im 5/95  bone]
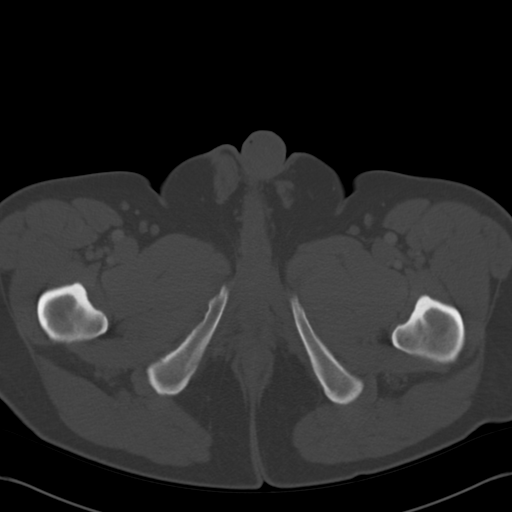
[im 15/95  soft-tissue]
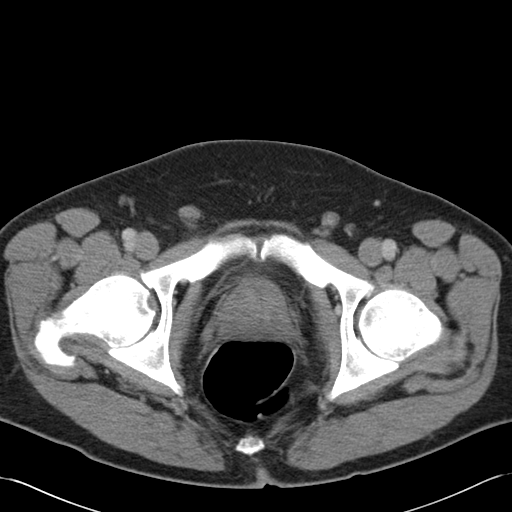
[im 20/95  soft-tissue]
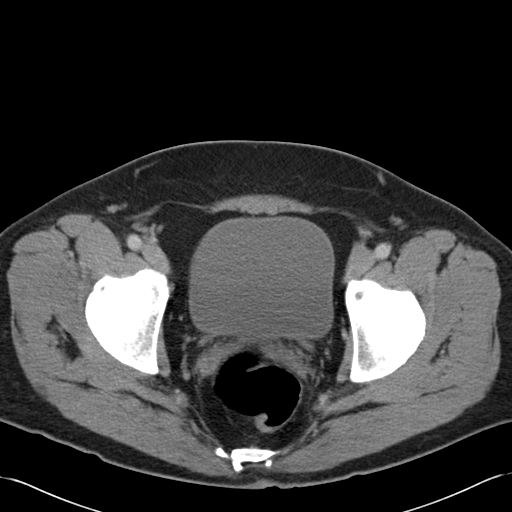
[im 30/95  soft-tissue]
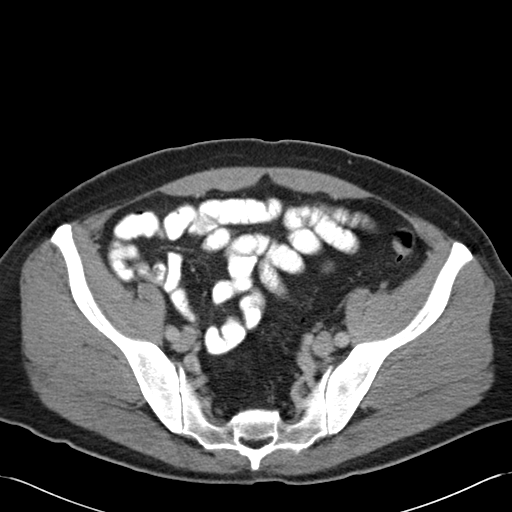
[im 35/95  soft-tissue]
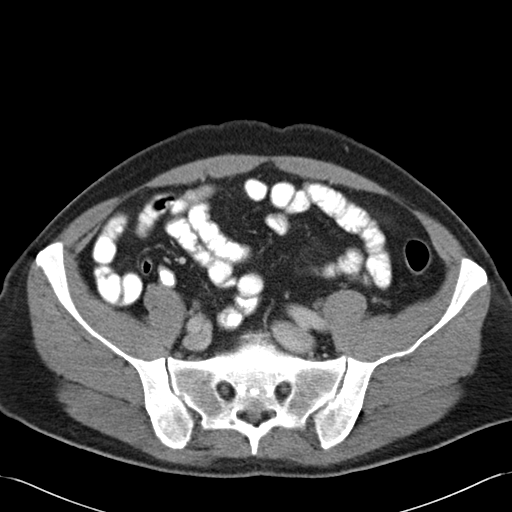
[im 45/95  soft-tissue]
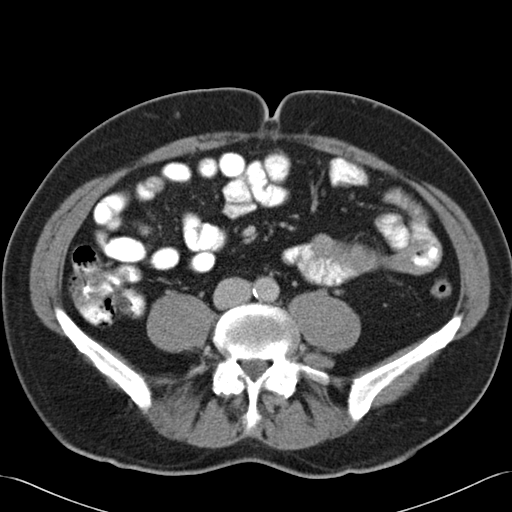
[im 50/95  soft-tissue]
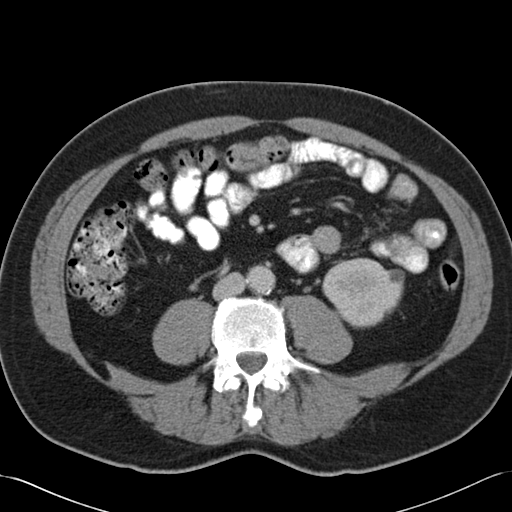
[im 60/95  soft-tissue]
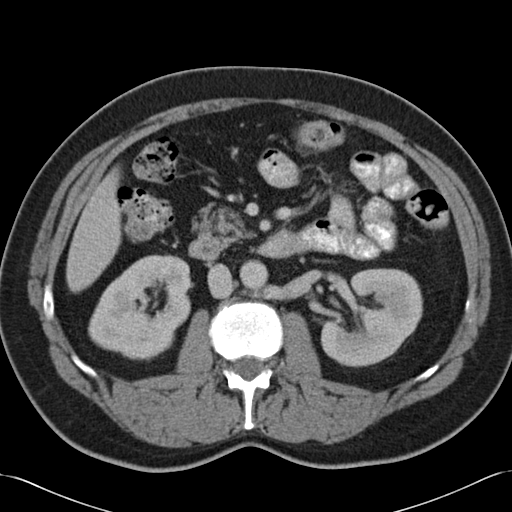
[im 65/95  soft-tissue]
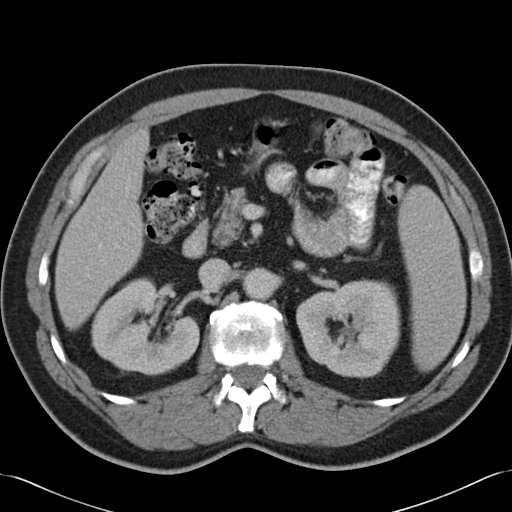
[im 65/95  bone]
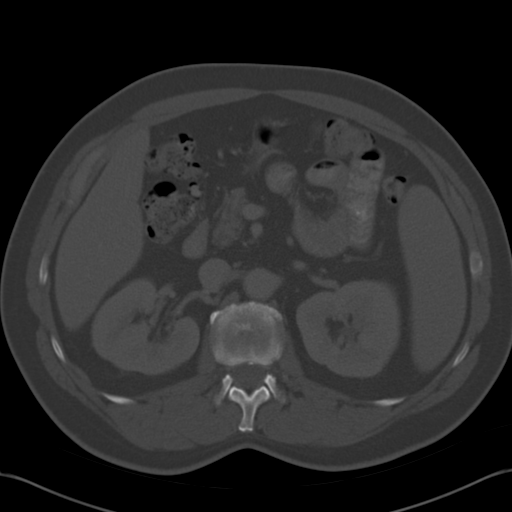
[im 75/95  soft-tissue]
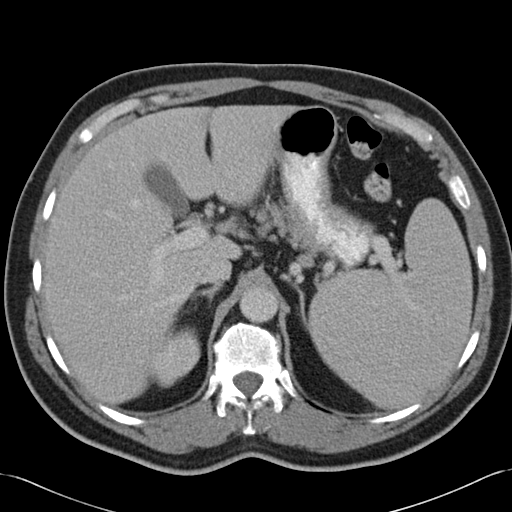
[im 80/95  soft-tissue]
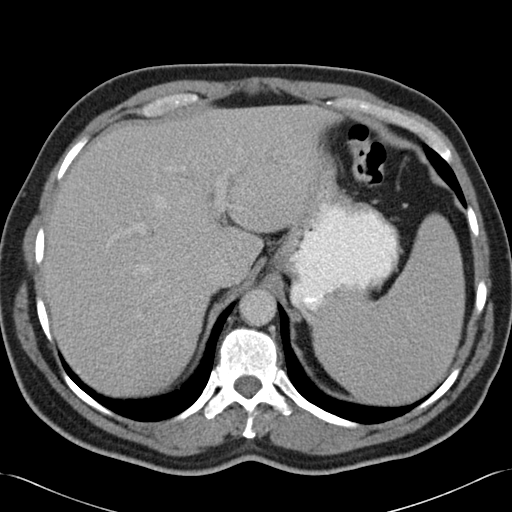
[im 90/95  soft-tissue]
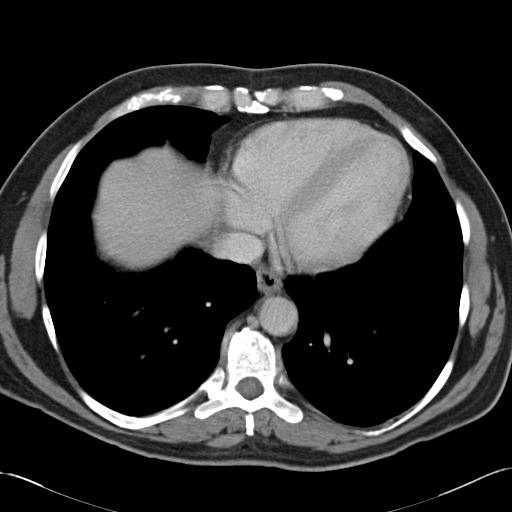

[Series 5: cor routine abd pel with · coronal · 0.68mm/px · 3 of 143 slices shown]
[im 48/143  soft-tissue]
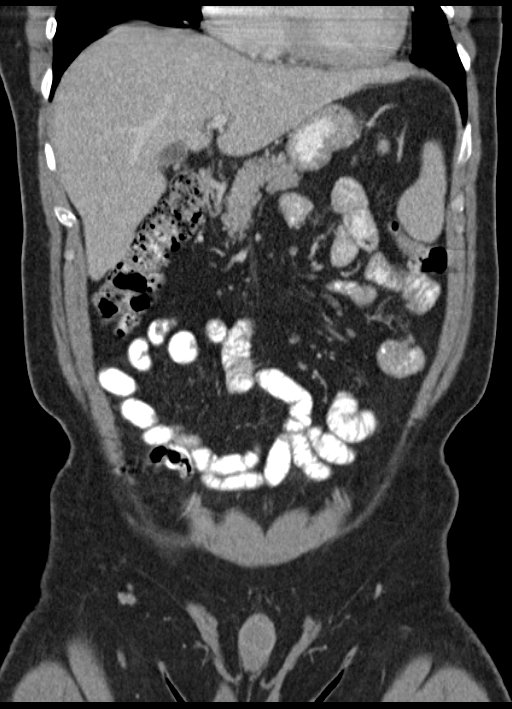
[im 64/143  soft-tissue]
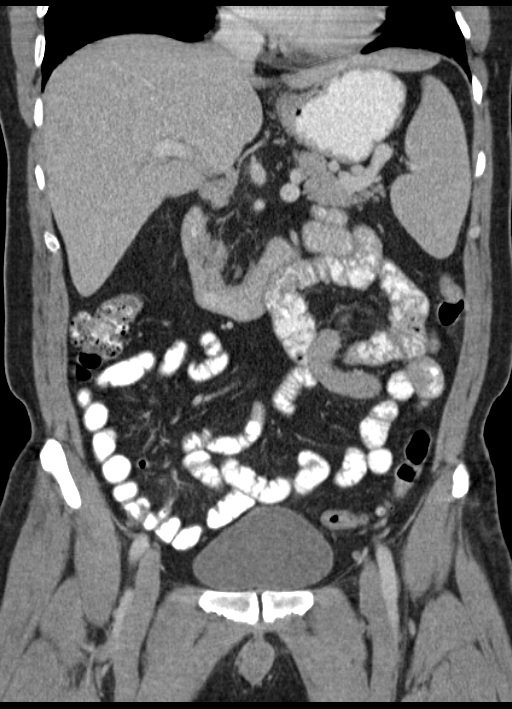
[im 79/143  soft-tissue]
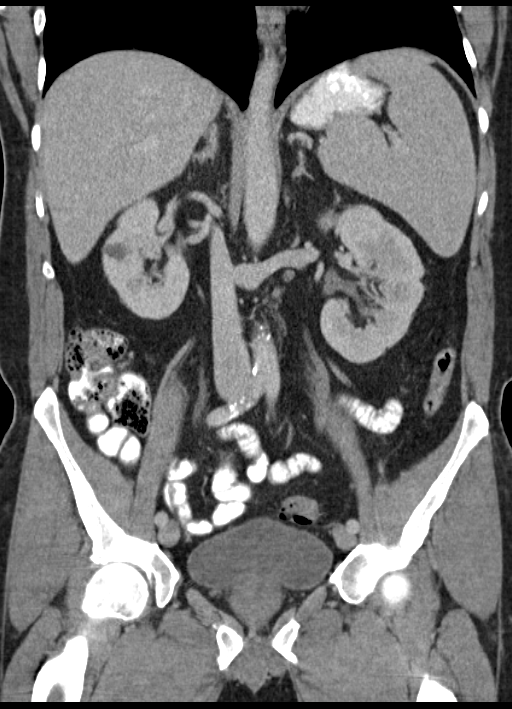

[15 of 46 positions shown; findings below may reference images not displayed]

FINDINGS: Lower chest:  Lung bases are clear.

Hepatobiliary: Liver is with Within normal limits.

Gallbladder is unremarkable. No intrahepatic or extrahepatic ductal
dilatation.

Pancreas: Within normal limits.

Spleen: Borderline enlarged, measuring 14.5 cm in craniocaudal
dimension.

Adrenals/Urinary Tract: Adrenal glands are unremarkable.

Two nonobstructing right lower pole renal calculi measuring up to
2-3 mm (series 2/image 37). Three nonobstructing left renal calculi
measuring up to 4 mm in the interpolar region (series 2/ image 38).
No hydronephrosis.

Multiple bilateral renal cysts, measuring up to 1.5 x 1.7 cm in the
lateral right lower kidney (series 2/image 39) and 9 x 12 mm in the
posterior interpolar left kidney (series 2/image 39), benign
(Bosniak I). No enhancing renal lesions.

No ureteral or bladder calculi.

Bladder is within normal limits.

Stomach/Bowel: Stomach is unremarkable.

No evidence of bowel obstruction.

Normal appendix.

Sigmoid diverticulosis, without associated inflammatory changes to
suggest acute diverticulitis.

Vascular/Lymphatic: Atherosclerotic calcifications of the abdominal
aorta and branch vessels.

Circumaortic left renal vein.

No suspicious abdominopelvic lymphadenopathy.

Reproductive: Prostate is unremarkable.

Other: No abdominopelvic ascites.

Musculoskeletal: Degenerative changes of the visualized
thoracolumbar spine.
IMPRESSION: Bilateral nonobstructing renal calculi measuring up to 4 mm. No
ureteral or bladder calculi. No hydronephrosis.

Bilateral renal cysts, measuring up to 1.7 cm, benign (Bosniak I).

No evidence of bowel obstruction.  Normal appendix.

Colonic diverticulosis, without diverticulitis.

## 2017-04-06 ENCOUNTER — Ambulatory Visit: Payer: BLUE CROSS/BLUE SHIELD | Admitting: Anesthesiology

## 2017-04-06 ENCOUNTER — Encounter
Admission: RE | Disposition: A | Discharge status: Home / Self Care | Payer: Self-pay | Source: Ambulatory Visit | Attending: Unknown Physician Specialty

## 2017-04-06 ENCOUNTER — Ambulatory Visit
Admission: RE | Admit: 2017-04-06 | Discharge: 2017-04-06 | Disposition: A | Discharge status: Home / Self Care | Payer: BLUE CROSS/BLUE SHIELD | Source: Ambulatory Visit | Attending: Unknown Physician Specialty | Admitting: Unknown Physician Specialty

## 2017-04-06 DIAGNOSIS — F410 Panic disorder [episodic paroxysmal anxiety] without agoraphobia: Secondary | ICD-10-CM | POA: Insufficient documentation

## 2017-04-06 DIAGNOSIS — Z1211 Encounter for screening for malignant neoplasm of colon: Secondary | ICD-10-CM | POA: Diagnosis not present

## 2017-04-06 DIAGNOSIS — Z86010 Personal history of colonic polyps: Secondary | ICD-10-CM | POA: Insufficient documentation

## 2017-04-06 DIAGNOSIS — Z7982 Long term (current) use of aspirin: Secondary | ICD-10-CM | POA: Insufficient documentation

## 2017-04-06 DIAGNOSIS — K5730 Diverticulosis of large intestine without perforation or abscess without bleeding: Secondary | ICD-10-CM | POA: Insufficient documentation

## 2017-04-06 DIAGNOSIS — Z87442 Personal history of urinary calculi: Secondary | ICD-10-CM | POA: Insufficient documentation

## 2017-04-06 DIAGNOSIS — K640 First degree hemorrhoids: Secondary | ICD-10-CM | POA: Diagnosis not present

## 2017-04-06 DIAGNOSIS — I10 Essential (primary) hypertension: Secondary | ICD-10-CM | POA: Diagnosis not present

## 2017-04-06 DIAGNOSIS — Z79899 Other long term (current) drug therapy: Secondary | ICD-10-CM | POA: Diagnosis not present

## 2017-04-06 HISTORY — PX: COLONOSCOPY WITH PROPOFOL: SHX5780

## 2017-04-06 SURGERY — COLONOSCOPY WITH PROPOFOL
Anesthesia: General

## 2017-04-06 MED ORDER — HYDROCORTISONE NA SUCCINATE PF 100 MG IJ SOLR
100.0000 mg | INTRAMUSCULAR | Status: AC
  Administered 2017-04-06: 100 mg via INTRAVENOUS

## 2017-04-06 MED ORDER — PIPERACILLIN-TAZOBACTAM 3.375 G IVPB
INTRAVENOUS | Status: AC
  Filled 2017-04-06: qty 50

## 2017-04-06 MED ORDER — LIDOCAINE HCL (PF) 2 % IJ SOLN
INTRAMUSCULAR | Status: AC
  Filled 2017-04-06: qty 10

## 2017-04-06 MED ORDER — HYDROCORTISONE NA SUCCINATE PF 100 MG IJ SOLR
INTRAMUSCULAR | Status: AC
Start: 2017-04-06 — End: 2017-04-06
  Filled 2017-04-06: qty 2

## 2017-04-06 MED ORDER — LIDOCAINE HCL (PF) 2 % IJ SOLN
INTRAMUSCULAR | Status: DC | PRN
  Administered 2017-04-06: 80 mg

## 2017-04-06 MED ORDER — PROPOFOL 500 MG/50ML IV EMUL
INTRAVENOUS | Status: DC | PRN
  Administered 2017-04-06: 50 ug/kg/min via INTRAVENOUS

## 2017-04-06 MED ORDER — SODIUM CHLORIDE 0.9 % IV SOLN
INTRAVENOUS | Status: DC
  Administered 2017-04-06: 1000 mL via INTRAVENOUS

## 2017-04-06 MED ORDER — SODIUM CHLORIDE 0.9 % IV SOLN
INTRAVENOUS | Status: DC
  Administered 2017-04-06: 11:00:00 via INTRAVENOUS

## 2017-04-06 MED ORDER — PROPOFOL 10 MG/ML IV BOLUS
INTRAVENOUS | Status: DC | PRN
  Administered 2017-04-06: 30 mg via INTRAVENOUS
  Administered 2017-04-06 (×2): 10 mg via INTRAVENOUS

## 2017-04-06 MED ORDER — SODIUM CHLORIDE 0.9 % IV SOLN
Freq: Once | INTRAVENOUS | Status: AC
  Administered 2017-04-06: 11:00:00 via INTRAVENOUS
  Filled 2017-04-06: qty 2

## 2017-04-06 MED ORDER — FENTANYL CITRATE (PF) 100 MCG/2ML IJ SOLN
INTRAMUSCULAR | Status: AC
  Filled 2017-04-06: qty 2

## 2017-04-06 MED ORDER — DIPHENHYDRAMINE HCL 50 MG/ML IJ SOLN
25.0000 mg | Freq: Once | INTRAMUSCULAR | Status: AC
  Administered 2017-04-06: 25 mg via INTRAVENOUS

## 2017-04-06 MED ORDER — MIDAZOLAM HCL 5 MG/5ML IJ SOLN
INTRAMUSCULAR | Status: DC | PRN
  Administered 2017-04-06: 2 mg via INTRAVENOUS

## 2017-04-06 MED ORDER — FENTANYL CITRATE (PF) 100 MCG/2ML IJ SOLN
INTRAMUSCULAR | Status: DC | PRN
  Administered 2017-04-06: 50 ug via INTRAVENOUS

## 2017-04-06 MED ORDER — DIPHENHYDRAMINE HCL 50 MG/ML IJ SOLN
INTRAMUSCULAR | Status: AC
  Filled 2017-04-06: qty 1

## 2017-04-06 MED ORDER — MIDAZOLAM HCL 2 MG/2ML IJ SOLN
INTRAMUSCULAR | Status: AC
  Filled 2017-04-06: qty 2

## 2017-04-06 MED ORDER — PIPERACILLIN-TAZOBACTAM 3.375 G IVPB 30 MIN
3.3750 g | Freq: Once | INTRAVENOUS | Status: AC
  Administered 2017-04-06: 3.375 g via INTRAVENOUS
  Filled 2017-04-06: qty 50

## 2017-04-06 NOTE — H&P (Signed)
Primary Care Physician:  Mick Sell, MD Primary Gastroenterologist:  Dr. Mechele Collin  Pre-Procedure History & Physical: HPI:  Jonathan Carter is a 57 y.o. male is here for an colonoscopy.  This is for previous colon polyps.  He has a hx of endocarditis and will need pre op antibiotics.   Past Medical History:  Diagnosis Date  . Anxiety    panic attacks  . Endocarditis 2015  . Headache    migraines  . Hypertension    no medication  . Kidney stones 2015    Past Surgical History:  Procedure Laterality Date  . SHOULDER ARTHROSCOPY WITH OPEN ROTATOR CUFF REPAIR Right 09/06/2014   Procedure: SHOULDER ARTHROSCOPY WITH OPEN ROTATOR CUFF REPAIR;  Surgeon: Juanell Fairly, MD;  Location: [facility] ORS;  Service: Orthopedics;  Laterality: Right;    Prior to Admission medications   Medication Sig Start Date End Date Taking? Authorizing Provider  hydrochlorothiazide (HYDRODIURIL) 25 MG tablet Take 25 mg by mouth daily.   Yes [provider]  LORazepam (ATIVAN) 0.5 MG tablet Take 0.5 mg by mouth every 8 (eight) hours.   Yes [provider]  aspirin EC 81 MG tablet Take 81 mg by mouth Nightly.    [provider]  Fish Oil-Cholecalciferol (FISH OIL + D3) 1200-1000 MG-UNIT CAPS Take 1 capsule by mouth Nightly.    [provider]  FLUoxetine (PROZAC) 20 MG capsule Take 20 mg by mouth at bedtime.    [provider]  fluticasone (FLONASE) 50 MCG/ACT nasal spray Place 1 spray into both nostrils daily as needed for allergies or rhinitis.    [provider]  Multiple Vitamins-Minerals (CENTRUM SILVER PO) Take 1 tablet by mouth at bedtime.    [provider]    Allergies as of 02/16/2017  . (No Known Allergies)    No family history on file.  Social History   Socioeconomic History  . Marital status: Married    Spouse name: Not on file  . Number of children: Not on file  . Years of education: Not on file  . Highest education  level: Not on file  Occupational History  . Not on file  Social Needs  . Financial resource strain: Not on file  . Food insecurity:    Worry: Not on file    Inability: Not on file  . Transportation needs:    Medical: Not on file    Non-medical: Not on file  Tobacco Use  . Smoking status: Never Smoker  Substance and Sexual Activity  . Alcohol use: No  . Drug use: No  . Sexual activity: Yes    Birth control/protection: None  Lifestyle  . Physical activity:    Days per week: Not on file    Minutes per session: Not on file  . Stress: Not on file  Relationships  . Social connections:    Talks on phone: Not on file    Gets together: Not on file    Attends religious service: Not on file    Active member of club or organization: Not on file    Attends meetings of clubs or organizations: Not on file    Relationship status: Not on file  . Intimate partner violence:    Fear of current or ex partner: Not on file    Emotionally abused: Not on file    Physically abused: Not on file    Forced sexual activity: Not on file  Other Topics Concern  . Not on file  Social History Narrative  . Not on file    Review of Systems: See HPI, otherwise negative ROS  Physical Exam: BP (!) 155/77   Pulse 66   Temp (!) 96 F (35.6 C) (Tympanic)   Resp 16   Ht 6' (1.829 m)   Wt 95.3 kg (210 lb)   SpO2 100%   BMI 28.48 kg/m  General:   Alert,  pleasant and cooperative in NAD Head:  Normocephalic and atraumatic. Neck:  Supple; no masses or thyromegaly. Lungs:  Clear throughout to auscultation.    Heart:  Regular rate and rhythm. Abdomen:  Soft, nontender and nondistended. Normal bowel sounds, without guarding, and without rebound.   Neurologic:  Alert and  oriented x4;  grossly normal neurologically.  Impression/Plan: Jonathan Carter is here for an colon[facility].  Risks, benefits, limitations, and alternatives regarding  colonoscopy have been  reviewed with the patient.  Questions have been answered.  All parties agreeable.   Lynnae PrudeELLIOTT, Jonathan Seldon, MD  04/06/2017, 9:26 AM

## 2017-04-06 NOTE — OR Nursing (Signed)
PT EXPERIENCED FACIAL AND BODY REDNESS, NUMBNESS IN LIPS  DURING ZOSYN TRANSFUSION. DR Mechele CollinELLIOTT ORDERED BENADRYL 25MG  IV AND SOLUCORTEF  100MG  IV STAT

## 2017-04-06 NOTE — Anesthesia Postprocedure Evaluation (Signed)
Anesthesia Post Note  Patient: Jonathan Carter  Procedure(s) Performed: COLONOSCOPY WITH PROPOFOL (N/A )  Patient location during evaluation: Endoscopy Anesthesia Type: General Level of consciousness: awake and alert Pain management: pain level controlled Vital Signs Assessment: post-procedure vital signs reviewed and stable Respiratory status: spontaneous breathing, nonlabored ventilation and respiratory function stable Cardiovascular status: blood pressure returned to baseline and stable Postop Assessment: no apparent nausea or vomiting Anesthetic complications: no     Last Vitals:  Vitals:   04/06/17 1118 04/06/17 1128  BP: 129/72 134/75  Pulse: 76 65  Resp: 14 12  Temp:    SpO2: 99% 95%    Last Pain:  Vitals:   04/06/17 1128  TempSrc:   PainSc: 0-No pain                 Christia ReadingScott T Neelie Welshans

## 2017-04-06 NOTE — Anesthesia Post-op Follow-up Note (Signed)
Anesthesia QCDR form completed.        

## 2017-04-06 NOTE — Op Note (Signed)
[facility] Patient Name: Jonathan Carter Procedure Date: 04/06/2017 10:33 AM MRN: [id number] Account #: [id number] Date of Birth: [birth year] Admit Type: Outpatient Age: 57 Room: [facility] ENDO ROOM 3 Gender: Male Note Status: Finalized Procedure:            Colonoscopy Indications:          High risk colon cancer surveillance: Personal history                        of colonic polyps Providers:            Scot Jun, MD Referring MD:         Clydie Braun (Referring MD) Medicines:            Propofol per Anesthesia Complications:        No immediate complications. Procedure:            Pre-Anesthesia Assessment:                       - After reviewing the risks and benefits, the patient                        was deemed in satisfactory condition to undergo the                        procedure.                       After obtaining informed consent, the colonoscope was                        passed under direct vision. Throughout the procedure,                        the patient's blood pressure, pulse, and oxygen                        saturations were monitored continuously. The                        Colonoscope was introduced through the anus and                        advanced to the the cecum, identified by appendiceal                        orifice and ileocecal valve. The colonoscopy was                        performed without difficulty. The patient tolerated the                        procedure well. The quality of the bowel preparation                        was excellent. Findings:      A few small-mouthed diverticula were found in the sigmoid colon.      Internal hemorrhoids were found during endoscopy. The hemorrhoids were       small and Grade I (internal hemorrhoids that do not prolapse).      The exam was otherwise without  abnormality. Impression:           - Diverticulosis in the sigmoid colon.                       -  The examination was otherwise normal.                       - Internal hemorrhoids.                       - The examination was otherwise normal.                       - No specimens collected. Recommendation:       - Repeat colonoscopy in 5 years for surveillance. Scot Junobert T Finnbar Cedillos, MD 04/06/2017 10:57:19 AM This report has been signed electronically. Number of Addenda: 0 Note Initiated On: 04/06/2017 10:33 AM Scope Withdrawal Time: 0 hours 9 minutes 26 seconds  Total Procedure Duration: 0 hours 15 minutes 36 seconds       [facility]

## 2017-04-06 NOTE — Anesthesia Preprocedure Evaluation (Signed)
Anesthesia Evaluation  Patient identified by MRN, date of birth, ID band Patient awake    Reviewed: Allergy & Precautions, H&P , NPO status , reviewed documented beta blocker date and time   Airway Mallampati: II  TM Distance: >3 FB     Dental  (+) Chipped   Pulmonary neg pulmonary ROS,    Pulmonary exam normal        Cardiovascular hypertension, Normal cardiovascular exam     Neuro/Psych  Headaches, PSYCHIATRIC DISORDERS Anxiety    GI/Hepatic Neg liver ROS,   Endo/Other  negative endocrine ROS  Renal/GU Renal diseaseHx Kidney stones     Musculoskeletal   Abdominal   Peds  Hematology negative hematology ROS (+)   Anesthesia Other Findings   Reproductive/Obstetrics                             Anesthesia Physical Anesthesia Plan  ASA: II  Anesthesia Plan: General   Post-op Pain Management:    Induction:   PONV Risk Score and Plan: 2 and Propofol infusion and TIVA  Airway Management Planned:   Additional Equipment:   Intra-op Plan:   Post-operative Plan:   Informed Consent: I have reviewed the patients History and Physical, chart, labs and discussed the procedure including the risks, benefits and alternatives for the proposed anesthesia with the patient or authorized representative who has indicated his/her understanding and acceptance.   Dental Advisory Given  Plan Discussed with: CRNA  Anesthesia Plan Comments:         Anesthesia Quick Evaluation

## 2017-04-06 NOTE — Transfer of Care (Signed)
Immediate Anesthesia Transfer of Care Note  Patient: Jonathan Carter  Procedure(s) Performed: COLONOSCOPY WITH PROPOFOL (N/A )  Patient Location: PACU  Anesthesia Type:General  Level of Consciousness: sedated  Airway & Oxygen Therapy: Patient Spontanous Breathing and Patient connected to nasal cannula oxygen  Post-op Assessment: Report given to RN and Post -op Vital signs reviewed and stable  Post vital signs: Reviewed and stable  Last Vitals:  Vitals Value Taken Time  BP    Temp    Pulse 62 04/06/2017 10:57 AM  Resp 11 04/06/2017 10:57 AM  SpO2 99 % 04/06/2017 10:57 AM  Vitals shown include unvalidated device data.  Last Pain:  Vitals:   04/06/17 0902  TempSrc: Tympanic         Complications: No apparent anesthesia complications

## 2017-04-06 NOTE — OR Nursing (Signed)
PT WITH REDNESS TO FACE FADING BUT BACK AND CHEST STILL RED. PT NOW WITH VISIBLE WELTS AND ITCHING TO ARMS. DR Providence LaniusHOWELL AND DR Lavell AnchorsELLIOT CONCUR. ORDERED ZNATAC 50MG  IV STAT. PHARMACIST KAREN NOTIFIED.Marland Kitchen.ZANTAC DOSE ENTERED BY PHARMACIST. MED NOW OFF FORMULARY

## 2017-04-07 ENCOUNTER — Encounter: Payer: Self-pay | Admitting: Unknown Physician Specialty

## 2017-08-06 ENCOUNTER — Encounter: Payer: Self-pay | Admitting: Urology

## 2017-08-06 ENCOUNTER — Ambulatory Visit: Payer: BLUE CROSS/BLUE SHIELD | Admitting: Urology

## 2017-08-06 VITALS — BP 172/85 | HR 76 | Resp 16 | Ht 72.0 in | Wt 215.4 lb

## 2017-08-06 DIAGNOSIS — Z87442 Personal history of urinary calculi: Secondary | ICD-10-CM | POA: Diagnosis not present

## 2017-08-06 DIAGNOSIS — E291 Testicular hypofunction: Secondary | ICD-10-CM

## 2017-08-06 LAB — URINALYSIS, COMPLETE
BILIRUBIN UA: NEGATIVE
Glucose, UA: NEGATIVE
KETONES UA: NEGATIVE
Nitrite, UA: NEGATIVE
PROTEIN UA: NEGATIVE
RBC UA: NEGATIVE
SPEC GRAV UA: 1.02 (ref 1.005–1.030)
Urobilinogen, Ur: 0.2 mg/dL (ref 0.2–1.0)
pH, UA: 7.5 (ref 5.0–7.5)

## 2017-08-06 LAB — MICROSCOPIC EXAMINATION
Epithelial Cells (non renal): NONE SEEN /hpf (ref 0–10)
RBC, UA: NONE SEEN /hpf (ref 0–2)

## 2017-08-07 NOTE — Progress Notes (Signed)
08/06/2017 7:33 AM   Jonathan Carter 12/30/1960 [id number]  Referring provider: Mick Sell, MD [address] [place], [place] [address]  Chief Complaint  Patient presents with  . Nephrolithiasis    HPI: 57 year old male presents to establish local urologic care.  He was previously followed by Dr. Achilles Dunk at [facility] and last saw him in October 2017.  He has a history of stone disease and passed a stone in 2016 which was apparently associated with a suspected enterococcal UTI and he subsequently developed bacterial endocarditis.  A CT scan in 2015 showed bilateral, nonobstructing renal calculi.  His last KUB and 2017 showed punctate bilateral lower pole calculi.  He presently denies flank or abdominal pain.  He denies gross hematuria.  He has no bothersome lower urinary tract symptoms.  He has had a sporadic PSA in the upper 4 range but his baseline PSA is in the low to mid 3 range.  Last PSA in October 2017 was 3.26.  He has a history of hypogonadism previously on TRT.  This was stopped around the time of his UTI.  He does complain of tiredness and fatigue.  I do not see any recent testosterone levels on chart review.   PMH: Past Medical History:  Diagnosis Date  . Anxiety    panic attacks  . Endocarditis 2015  . Headache    migraines  . Hypertension    no medication  . Kidney stones 2015    Surgical History: Past Surgical History:  Procedure Laterality Date  . COLONOSCOPY WITH PROPOFOL N/A 04/06/2017   Procedure: COLONOSCOPY WITH PROPOFOL;  Surgeon: Scot Jun, MD;  Location: [facility] ENDOSCOPY;  Service: Endoscopy;  Laterality: N/A;  . SHOULDER ARTHROSCOPY WITH OPEN ROTATOR CUFF REPAIR Right 09/06/2014   Procedure: SHOULDER ARTHROSCOPY WITH OPEN ROTATOR CUFF REPAIR;  Surgeon: Juanell Fairly, MD;  Location: [facility] ORS;  Service: Orthopedics;  Laterality: Right;    Home Medications:  Allergies as of 08/06/2017      Reactions   Penicillins    Zosyn  [piperacillin-tazobactam In Dex]       Medication List        Accurate as of 08/06/17 11:59 PM. Always use your most recent med list.          aspirin EC 81 MG tablet Take 81 mg by mouth Nightly.   CENTRUM SILVER PO Take 1 tablet by mouth at bedtime.   FISH OIL + D3 1200-1000 MG-UNIT Caps Take 1 capsule by mouth Nightly.   FLUoxetine 20 MG capsule Commonly known as:  PROZAC Take 20 mg by mouth at bedtime.   fluticasone 50 MCG/ACT nasal spray Commonly known as:  FLONASE Place 1 spray into both nostrils daily as needed for allergies or rhinitis.   hydrochlorothiazide 25 MG tablet Commonly known as:  HYDRODIURIL Take 25 mg by mouth daily.   LORazepam 0.5 MG tablet Commonly known as:  ATIVAN Take 0.5 mg by mouth every 8 (eight) hours.       Allergies:  Allergies  Allergen Reactions  . Penicillins   . Zosyn [Piperacillin-Tazobactam In Dex]     Family History: History reviewed. No pertinent family history.  Social History:  reports that he has never smoked. He has never used smokeless tobacco. He reports that he does not drink alcohol or use drugs.  ROS: UROLOGY Frequent Urination?: No Hard to postpone urination?: No Burning/pain with urination?: No Get up at night to urinate?: No Leakage of urine?: No Urine stream starts and  stops?: No Trouble starting stream?: No Do you have to strain to urinate?: No Blood in urine?: No Urinary tract infection?: No Sexually transmitted disease?: No Injury to kidneys or bladder?: No Painful intercourse?: No Weak stream?: No Erection problems?: No Penile pain?: No  Gastrointestinal Nausea?: No Vomiting?: No Indigestion/heartburn?: No Diarrhea?: No Constipation?: No  Constitutional Fever: No Night sweats?: No Weight loss?: No Fatigue?: No  Skin Skin rash/lesions?: No Itching?: No  Eyes Blurred vision?: No Double vision?: No  Ears/Nose/Throat Sore throat?: No Sinus problems?:  No  Hematologic/Lymphatic Swollen glands?: No Easy bruising?: No  Cardiovascular Leg swelling?: No Chest pain?: No  Respiratory Cough?: No Shortness of breath?: No  Endocrine Excessive thirst?: No  Musculoskeletal Back pain?: No Joint pain?: No  Neurological Headaches?: Yes Dizziness?: No  Psychologic Depression?: No Anxiety?: Yes  Physical Exam: BP (!) 172/85   Pulse 76   Resp 16   Ht 6' (1.829 m)   Wt 215 lb 6.4 oz (97.7 kg)   SpO2 96%   BMI 29.21 kg/m   Constitutional:  Alert and oriented, No acute distress. HEENT: [place] AT, moist mucus membranes.  Trachea midline, no masses. Cardiovascular: No clubbing, cyanosis, or edema. Respiratory: Normal respiratory effort, no increased work of breathing. GI: Abdomen is soft, nontender, nondistended, no abdominal masses GU: No CVA tenderness.  Penis without lesions, testes descended bilateral without masses or tenderness, no paratesticular abnormalities.  Prostate 40 g, smooth without nodules. Lymph: No cervical or inguinal lymphadenopathy. Skin: No rashes, bruises or suspicious lesions. Neurologic: Grossly intact, no focal deficits, moving all 4 extremities. Psychiatric: Normal mood and affect.   Assessment & Plan:   1.  Nephrolithiasis His last imaging was in 2017 and a KUB was ordered.  He will be notified with results.  If stable follow-up annually  2.  Hypogonadism He was interested in restarting TRT.  Will need to check an a.m. testosterone level, LH and PSA.    Return in about 1 year (around 08/07/2018) for Recheck, KUB.   Riki AltesScott C Harrison Paulson, MD  [facility] [address], [address] [place], [place] [address] [phone]

## 2017-08-09 ENCOUNTER — Encounter: Payer: Self-pay | Admitting: Urology

## 2017-08-10 ENCOUNTER — Telehealth: Payer: Self-pay | Admitting: Urology

## 2017-08-10 NOTE — Telephone Encounter (Signed)
Left message for patient to call back to schedule a lab app.  Jonathan DusterMichelle

## 2017-08-10 NOTE — Telephone Encounter (Signed)
-----   Message from Riki AltesScott C Stoioff, MD sent at 08/09/2017  2:01 PM EDT ----- Needs PSA, T level and LH prior to starting TRT

## 2017-08-10 NOTE — Telephone Encounter (Signed)
Patient is coming in tomorrow 08-11-17 to have his labs drawn    [facility]

## 2017-08-11 ENCOUNTER — Other Ambulatory Visit: Payer: BLUE CROSS/BLUE SHIELD

## 2017-08-11 ENCOUNTER — Ambulatory Visit
Admission: RE | Admit: 2017-08-11 | Discharge: 2017-08-11 | Disposition: A | Discharge status: Home / Self Care | Payer: BLUE CROSS/BLUE SHIELD | Source: Ambulatory Visit | Attending: Urology | Admitting: Urology

## 2017-08-11 ENCOUNTER — Other Ambulatory Visit: Payer: Self-pay

## 2017-08-11 DIAGNOSIS — N200 Calculus of kidney: Secondary | ICD-10-CM | POA: Insufficient documentation

## 2017-08-11 DIAGNOSIS — Z87442 Personal history of urinary calculi: Secondary | ICD-10-CM | POA: Diagnosis present

## 2017-08-11 DIAGNOSIS — E291 Testicular hypofunction: Secondary | ICD-10-CM

## 2017-08-12 LAB — TESTOSTERONE: Testosterone: 279 ng/dL (ref 264–916)

## 2017-08-12 LAB — PSA: Prostate Specific Ag, Serum: 3.7 ng/mL (ref 0.0–4.0)

## 2017-08-12 LAB — LUTEINIZING HORMONE: LH: 4.2 m[IU]/mL (ref 1.7–8.6)

## 2017-08-13 ENCOUNTER — Telehealth: Payer: Self-pay | Admitting: Family Medicine

## 2017-08-13 NOTE — Telephone Encounter (Signed)
-----   Message from Riki AltesScott C Stoioff, MD sent at 08/13/2017  7:03 AM EDT ----- Testosterone level is low normal.  If he was planning on getting insurance approval of his testosterone will need to get a free testosterone level and then repeat a second level.

## 2017-08-13 NOTE — Telephone Encounter (Signed)
Patient notified and will call back to schedule a lab visit for Testosterone and Free Testosterone in a couple weeks before 9:00AM

## 2017-08-13 NOTE — Telephone Encounter (Signed)
LMOM for patient to return call.

## 2017-08-13 NOTE — Telephone Encounter (Signed)
-----   Message from Riki AltesScott C Stoioff, MD sent at 08/13/2017  7:09 AM EDT ----- KUB shows bilateral renal calculi.  Repeat KUB 1 year

## 2018-08-13 ENCOUNTER — Ambulatory Visit
Admission: RE | Admit: 2018-08-13 | Discharge: 2018-08-13 | Disposition: A | Discharge status: Home / Self Care | Payer: BLUE CROSS/BLUE SHIELD | Source: Ambulatory Visit | Attending: Urology | Admitting: Urology

## 2018-08-13 ENCOUNTER — Encounter: Payer: Self-pay | Admitting: Urology

## 2018-08-13 ENCOUNTER — Other Ambulatory Visit: Payer: Self-pay

## 2018-08-13 ENCOUNTER — Ambulatory Visit: Payer: BLUE CROSS/BLUE SHIELD | Admitting: Urology

## 2018-08-13 VITALS — BP 149/87 | HR 80 | Ht 72.0 in | Wt 214.0 lb

## 2018-08-13 DIAGNOSIS — E291 Testicular hypofunction: Secondary | ICD-10-CM | POA: Diagnosis not present

## 2018-08-13 DIAGNOSIS — R309 Painful micturition, unspecified: Secondary | ICD-10-CM | POA: Diagnosis present

## 2018-08-13 DIAGNOSIS — Z87442 Personal history of urinary calculi: Secondary | ICD-10-CM | POA: Insufficient documentation

## 2018-08-13 DIAGNOSIS — N200 Calculus of kidney: Secondary | ICD-10-CM | POA: Insufficient documentation

## 2018-08-13 NOTE — Progress Notes (Signed)
08/13/2018 2:20 PM   Zoila Shutter [birth year] [id number]  Referring provider: Leonel Ramsay, MD [address] [place],  [place] [address]  Chief Complaint  Patient presents with  . 1 yr fu    Urologic history:  1.  Nephrolithiasis  -Bilateral nonobstructing renal calculi  2.  History hypogonadism  -Previously on TRT  HPI: 58 y.o. male presents for annual follow-up.  Since last year he states he has done well.  2 weeks ago he was having mild dysuria.  He was seen at [facility] and urinalysis showed 4-10 WBCs. Urine culture grew mixed flora.  His dysuria has almost resolved.  He denied fever or chills.  A PSA June 2020 was 3.13.  Denies flank, abdominal, pelvic or scrotal pain.  PMH: Past Medical History:  Diagnosis Date  . Anxiety    panic attacks  . Endocarditis 2015  . Headache    migraines  . Hypertension    no medication  . Kidney stones 2015    Surgical History: Past Surgical History:  Procedure Laterality Date  . COLONOSCOPY WITH PROPOFOL N/A 04/06/2017   Procedure: COLONOSCOPY WITH PROPOFOL;  Surgeon: Manya Silvas, MD;  Location: [facility] ENDOSCOPY;  Service: Endoscopy;  Laterality: N/A;  . SHOULDER ARTHROSCOPY WITH OPEN ROTATOR CUFF REPAIR Right 09/06/2014   Procedure: SHOULDER ARTHROSCOPY WITH OPEN ROTATOR CUFF REPAIR;  Surgeon: Thornton Park, MD;  Location: [facility] ORS;  Service: Orthopedics;  Laterality: Right;    Home Medications:  Allergies as of 08/13/2018      Reactions   Penicillins    Zosyn [piperacillin-tazobactam In Dex]       Medication List       Accurate as of August 13, 2018  2:20 PM. If you have any questions, ask your nurse or doctor.        aspirin EC 81 MG tablet Take 81 mg by mouth Nightly.   CENTRUM SILVER PO Take 1 tablet by mouth at bedtime.   Fish Oil + D3 1200-1000 MG-UNIT Caps Take 1 capsule by mouth Nightly.   FLUoxetine 20 MG capsule Commonly known as: PROZAC Take 20 mg by mouth at  bedtime.   fluticasone 50 MCG/ACT nasal spray Commonly known as: FLONASE Place 1 spray into both nostrils daily as needed for allergies or rhinitis.   hydrochlorothiazide 25 MG tablet Commonly known as: HYDRODIURIL Take 25 mg by mouth daily.   LORazepam 0.5 MG tablet Commonly known as: ATIVAN Take 0.5 mg by mouth every 8 (eight) hours.       Allergies:  Allergies  Allergen Reactions  . Penicillins   . Zosyn [Piperacillin-Tazobactam In Dex]     Family History: No family history on file.  Social History:  reports that he has never smoked. He has never used smokeless tobacco. He reports that he does not drink alcohol or use drugs.  ROS: UROLOGY Frequent Urination?: No Hard to postpone urination?: No Burning/pain with urination?: Yes Get up at night to urinate?: No Leakage of urine?: No Urine stream starts and stops?: No Trouble starting stream?: No Do you have to strain to urinate?: No Blood in urine?: No Urinary tract infection?: No Sexually transmitted disease?: No Injury to kidneys or bladder?: No Painful intercourse?: No Weak stream?: No Erection problems?: No Penile pain?: No  Gastrointestinal Nausea?: No Vomiting?: No Indigestion/heartburn?: No Diarrhea?: No Constipation?: No  Constitutional Fever: No Night sweats?: No Weight loss?: No Fatigue?: No  Skin Skin rash/lesions?: No Itching?: No  Eyes Blurred  vision?: No Double vision?: No  Ears/Nose/Throat Sore throat?: No Sinus problems?: No  Hematologic/Lymphatic Swollen glands?: No Easy bruising?: No  Cardiovascular Leg swelling?: No Chest pain?: No  Respiratory Cough?: No Shortness of breath?: No  Endocrine Excessive thirst?: No  Musculoskeletal Back pain?: No Joint pain?: No  Neurological Headaches?: No Dizziness?: No  Psychologic Depression?: No Anxiety?: No  Physical Exam: BP (!) 149/87 (BP Location: Left Arm, Patient Position: Sitting)   Pulse 80   Ht 6'  (1.829 m)   Wt 214 lb (97.1 kg)   BMI 29.02 kg/m   Constitutional:  Alert and oriented, No acute distress. HEENT: [place] AT, moist mucus membranes.  Trachea midline, no masses. Cardiovascular: No clubbing, cyanosis, or edema. Respiratory: Normal respiratory effort, no increased work of breathing. GI: Abdomen is soft, nontender, nondistended, no abdominal masses GU: No CVA tenderness Lymph: No cervical or inguinal lymphadenopathy. Skin: No rashes, bruises or suspicious lesions. Neurologic: Grossly intact, no focal deficits, moving all 4 extremities. Psychiatric: Normal mood and affect.   Assessment & Plan:    - Nephrolithiasis Bilateral nonobstructing renal calculi.  A KUB was ordered and he will be notified with the results.  - History of hypogonadism He does have some tiredness fatigue.  He had a low normal total testosterone last year.  Will check a LH, total and free testosterone.  Return in about 1 year (around 08/13/2019) for Recheck.   Riki AltesScott C Geovonni Meyerhoff, MD  [facility] [address], [address] [place], [place] [address] [phone]

## 2018-08-14 LAB — TESTOSTERONE FREE, PROFILE I
Albumin: 4.5 g/dL (ref 3.8–4.9)
Sex Hormone Binding: 34.3 nmol/L (ref 19.3–76.4)
Testost., Free, Calc: 44.9 pg/mL (ref 35.8–168.2)
Testosterone: 243 ng/dL — ABNORMAL LOW (ref 264–916)

## 2018-08-14 LAB — LUTEINIZING HORMONE: LH: 4.5 m[IU]/mL (ref 1.7–8.6)

## 2018-08-14 LAB — PSA: Prostate Specific Ag, Serum: 3.4 ng/mL (ref 0.0–4.0)

## 2018-08-16 ENCOUNTER — Telehealth: Payer: Self-pay

## 2018-08-16 NOTE — Telephone Encounter (Signed)
-----   Message from Abbie Sons, MD sent at 08/15/2018 11:47 AM EDT ----- KUB shows stable, bilateral renal calculi

## 2018-08-16 NOTE — Telephone Encounter (Signed)
-----   Message from Abbie Sons, MD sent at 08/15/2018 11:46 AM EDT ----- Free testosterone level was low normal range.  PSA stable 3.4

## 2018-08-16 NOTE — Telephone Encounter (Signed)
Called pt no answer. LM per DPR informing him of results. Advised pt to call back for questions or concerns.

## 2018-08-16 NOTE — Telephone Encounter (Signed)
Called and advised patient of results. Patient satisfied and verbalized understanding.

## 2019-08-19 ENCOUNTER — Ambulatory Visit: Payer: BLUE CROSS/BLUE SHIELD | Admitting: Urology

## 2019-09-02 ENCOUNTER — Ambulatory Visit
Admission: RE | Admit: 2019-09-02 | Discharge: 2019-09-02 | Disposition: A | Discharge status: Home / Self Care | Payer: Self-pay | Source: Ambulatory Visit | Attending: Urology | Admitting: Urology

## 2019-09-02 ENCOUNTER — Other Ambulatory Visit: Payer: Self-pay

## 2019-09-02 ENCOUNTER — Encounter: Payer: Self-pay | Admitting: Urology

## 2019-09-02 ENCOUNTER — Ambulatory Visit (INDEPENDENT_AMBULATORY_CARE_PROVIDER_SITE_OTHER): Payer: 59 | Admitting: Urology

## 2019-09-02 ENCOUNTER — Ambulatory Visit
Admission: RE | Admit: 2019-09-02 | Discharge: 2019-09-02 | Disposition: A | Discharge status: Home / Self Care | Payer: Self-pay | Attending: Urology | Admitting: Urology

## 2019-09-02 VITALS — BP 180/89 | HR 68 | Ht 72.0 in | Wt 210.0 lb

## 2019-09-02 DIAGNOSIS — N200 Calculus of kidney: Secondary | ICD-10-CM | POA: Diagnosis not present

## 2019-09-02 DIAGNOSIS — Z125 Encounter for screening for malignant neoplasm of prostate: Secondary | ICD-10-CM

## 2019-09-02 DIAGNOSIS — E291 Testicular hypofunction: Secondary | ICD-10-CM

## 2019-09-02 DIAGNOSIS — Z8639 Personal history of other endocrine, nutritional and metabolic disease: Secondary | ICD-10-CM | POA: Diagnosis not present

## 2019-09-02 NOTE — Progress Notes (Signed)
09/02/2019 12:21 PM   Jonathan Carter [birth year] [id number]  Referring provider: Mick Sell, MD [address] [place],  [place] [address]  Chief Complaint  Patient presents with  . Nephrolithiasis    Urologic history:  1.  Nephrolithiasis             -Bilateral nonobstructing renal calculi  2.  History hypogonadism             -Previously on TRT  HPI: 59 y.o. male presents for annual follow-up.   Since visit last year and has done well and denies flank, abdominal or pelvic pain  Some persistent tiredness and fatigue; testosterone level last year was low normal  Interested in restarting TRT if testosterone abnormal however wants to check with PCP and cardiologist prior to restarting  States PSA has been checked since last year however do not see an Marketing executive Everywhere   PMH: Past Medical History:  Diagnosis Date  . Anxiety    panic attacks  . Endocarditis 2015  . Headache    migraines  . Hypertension    no medication  . Kidney stones 2015    Surgical History: Past Surgical History:  Procedure Laterality Date  . COLONOSCOPY WITH PROPOFOL N/A 04/06/2017   Procedure: COLONOSCOPY WITH PROPOFOL;  Surgeon: Scot Jun, MD;  Location[facility] ENDOSCOPY;  Service: Endoscopy;  Laterality: N/A;  . SHOULDER ARTHROSCOPY WITH OPEN ROTATOR CUFF REPAIR Right 09/06/2014   Procedure: SHOULDER ARTHROSCOPY WITH OPEN ROTATOR CUFF REPAIR;  Surgeon: Juanell Fairly, MD;  Location: [facility] ORS;  Service: Orthopedics;  Laterality: Right;    Home Medications:  Allergies as of 09/02/2019      Reactions   Penicillins    Zosyn [piperacillin-tazobactam In Dex]       Medication List       Accurate as of September 02, 2019 12:21 PM. If you have any questions, ask your nurse or doctor.        STOP taking these medications   LORazepam 0.5 MG tablet Commonly known as: ATIVAN Stopped by: [name], MD     TAKE these medications   aspirin EC 81 MG  tablet Take 81 mg by mouth Nightly.   CENTRUM SILVER PO Take 1 tablet by mouth at bedtime.   Cholecalciferol 50 MCG (2000 UT) Caps Take by mouth.   Fish Oil + D3 1200-1000 MG-UNIT Caps Take 1 capsule by mouth Nightly.   FLUoxetine 20 MG capsule Commonly known as: PROZAC Take 20 mg by mouth at bedtime.   fluticasone 50 MCG/ACT nasal spray Commonly known as: FLONASE Place 1 spray into both nostrils daily as needed for allergies or rhinitis.   hydrochlorothiazide 25 MG tablet Commonly known as: HYDRODIURIL Take 25 mg by mouth daily.   omeprazole 40 MG capsule Commonly known as: PRILOSEC Take 40 mg by mouth daily.   pantoprazole 40 MG tablet Commonly known as: PROTONIX Take by mouth.   RA Fish Oil 1000 MG Caps Take 1 capsule by mouth daily.       Allergies:  Allergies  Allergen Reactions  . Penicillins   . Zosyn [Piperacillin-Tazobactam In Dex]     Family History: History reviewed. No pertinent family history.  Social History:  reports that he has never smoked. He has never used smokeless tobacco. He reports that he does not drink alcohol and does not use drugs.   Physical Exam: BP (!) 180/89   Pulse 68   Ht 6' (1.829 m)   Wt 210  lb (95.3 kg)   BMI 28.48 kg/m   Constitutional:  Alert and oriented, No acute distress. HEENT: [place] AT, moist mucus membranes.  Trachea midline, no masses. Cardiovascular: No clubbing, cyanosis, or edema. Respiratory: Normal respiratory effort, no increased work of breathing. Skin: No rashes, bruises or suspicious lesions. Neurologic: Grossly intact, no focal deficits, moving all 4 extremities. Psychiatric: Normal mood and affect.   Assessment & Plan:    1.  Bilateral nephrolithiasis  Asymptomatic  KUB ordered and he will be notified with results  2.  History of hypogonadism  Schedule a.m. lab visit for free/total testosterone  3.  Prostate cancer screening  Last PSA July 2020  Will add on to above blood  work   [name], MD  [facility] [address] [place], [place] [address] [phone]

## 2019-09-02 NOTE — Patient Instructions (Signed)
KUB today 1 year follow up Will call with blood work results

## 2019-09-03 ENCOUNTER — Encounter: Payer: Self-pay | Admitting: Urology

## 2019-09-03 DIAGNOSIS — Z8639 Personal history of other endocrine, nutritional and metabolic disease: Secondary | ICD-10-CM | POA: Insufficient documentation

## 2019-09-03 DIAGNOSIS — Z125 Encounter for screening for malignant neoplasm of prostate: Secondary | ICD-10-CM | POA: Insufficient documentation

## 2019-09-03 DIAGNOSIS — N200 Calculus of kidney: Secondary | ICD-10-CM | POA: Insufficient documentation

## 2019-09-05 ENCOUNTER — Telehealth: Payer: Self-pay | Admitting: *Deleted

## 2019-09-05 NOTE — Telephone Encounter (Signed)
Notified patient as instructed, patient pleased. Discussed follow-up appointments, patient agrees  

## 2019-09-05 NOTE — Telephone Encounter (Signed)
-----   Message from [name], MD sent at 09/04/2019  9:14 PM EDT ----- KUB showed stable, bilateral renal calculi

## 2019-09-05 NOTE — Telephone Encounter (Signed)
Pt returned your call, he has sketchy cell service where he lives and asks that a detailed message to be left if he is not able to answer. Please advise.

## 2019-09-09 ENCOUNTER — Other Ambulatory Visit: Payer: Self-pay

## 2019-09-09 DIAGNOSIS — Z8639 Personal history of other endocrine, nutritional and metabolic disease: Secondary | ICD-10-CM

## 2019-09-09 DIAGNOSIS — Z125 Encounter for screening for malignant neoplasm of prostate: Secondary | ICD-10-CM

## 2019-09-10 LAB — TESTOSTERONE FREE, PROFILE I
Albumin: 4.6 g/dL (ref 3.8–4.8)
Sex Hormone Binding: 30.3 nmol/L (ref 19.3–76.4)
Testost., Free, Calc: 46.6 pg/mL (ref 34.7–150.3)
Testosterone: 238 ng/dL — ABNORMAL LOW (ref 264–916)

## 2019-09-10 LAB — PSA: Prostate Specific Ag, Serum: 3.9 ng/mL (ref 0.0–4.0)

## 2019-09-12 ENCOUNTER — Telehealth: Payer: Self-pay | Admitting: *Deleted

## 2019-09-12 NOTE — Telephone Encounter (Signed)
-----   Message from [name], MD sent at 09/11/2019  2:34 PM EDT ----- Testosterone level low at 238.  PSA stable at 3.9

## 2019-09-13 NOTE — Telephone Encounter (Signed)
Notified patient as instructed, patient pleased. Discussed follow-up appointments, patient agrees  

## 2020-09-07 ENCOUNTER — Ambulatory Visit: Payer: Self-pay | Admitting: Urology

## 2020-09-12 ENCOUNTER — Ambulatory Visit
Admission: RE | Admit: 2020-09-12 | Discharge: 2020-09-12 | Disposition: A | Discharge status: Home / Self Care | Payer: 59 | Source: Ambulatory Visit | Attending: Urology | Admitting: Urology

## 2020-09-12 ENCOUNTER — Other Ambulatory Visit: Payer: Self-pay

## 2020-09-12 ENCOUNTER — Ambulatory Visit (INDEPENDENT_AMBULATORY_CARE_PROVIDER_SITE_OTHER): Payer: 59 | Admitting: Urology

## 2020-09-12 VITALS — BP 179/99 | HR 90 | Ht 72.0 in | Wt 210.0 lb

## 2020-09-12 DIAGNOSIS — Z125 Encounter for screening for malignant neoplasm of prostate: Secondary | ICD-10-CM

## 2020-09-12 DIAGNOSIS — N200 Calculus of kidney: Secondary | ICD-10-CM | POA: Diagnosis present

## 2020-09-12 DIAGNOSIS — E291 Testicular hypofunction: Secondary | ICD-10-CM

## 2020-09-12 DIAGNOSIS — Z8639 Personal history of other endocrine, nutritional and metabolic disease: Secondary | ICD-10-CM

## 2020-09-13 ENCOUNTER — Encounter: Payer: Self-pay | Admitting: Urology

## 2020-09-13 LAB — PSA: Prostate Specific Ag, Serum: 3.6 ng/mL (ref 0.0–4.0)

## 2020-09-13 LAB — TESTOSTERONE: Testosterone: 223 ng/dL — ABNORMAL LOW (ref 264–916)

## 2020-09-13 NOTE — Progress Notes (Signed)
09/12/2020 9:14 AM   Jonathan Carter March 05, 1960 [id number]  Referring provider: Mick Sell, MD [address] [place],  [place] [address]  Chief Complaint  Patient presents with   Nephrolithiasis    1.  Nephrolithiasis             -Bilateral nonobstructing renal calculi   2.  History hypogonadism             -Previously on TRT  HPI: 60 y.o. male presents for annual follow-up.  Doing well since last visit No bothersome LUTS Denies dysuria, gross hematuria Denies flank, abdominal or pelvic pain Does note increased tiredness/fatigue Testosterone level last year low at 238 ng/dL States cardiology is reluctant to approve TRT though I do not see this documented anywhere in the chart   PMH: Past Medical History:  Diagnosis Date   Anxiety    panic attacks   Endocarditis 2015   Headache    migraines   Hypertension    no medication   Kidney stones 2015    Surgical History: Past Surgical History:  Procedure Laterality Date   COLONOSCOPY WITH PROPOFOL N/A 04/06/2017   Procedure: COLONOSCOPY WITH PROPOFOL;  Surgeon: Scot Jun, MD;  Location: [facility] ENDOSCOPY;  Service: Endoscopy;  Laterality: N/A;   SHOULDER ARTHROSCOPY WITH OPEN ROTATOR CUFF REPAIR Right 09/06/2014   Procedure: SHOULDER ARTHROSCOPY WITH OPEN ROTATOR CUFF REPAIR;  Surgeon: Juanell Fairly, MD;  Location: [facility] ORS;  Service: Orthopedics;  Laterality: Right;    Home Medications:  Allergies as of 09/12/2020       Reactions   Penicillins    Zosyn [piperacillin-tazobactam In Dex]         Medication List        Accurate as of September 12, 2020 11:59 PM. If you have any questions, ask your nurse or doctor.          STOP taking these medications    omeprazole 40 MG capsule Commonly known as: PRILOSEC Stopped by: [name], MD   pantoprazole 40 MG tablet Commonly known as: PROTONIX Stopped by: [name], MD       TAKE these medications    aspirin EC 81  MG tablet Take 81 mg by mouth Nightly.   CENTRUM SILVER PO Take 1 tablet by mouth at bedtime.   Cholecalciferol 50 MCG (2000 UT) Caps Take by mouth.   Fish Oil + D3 1200-1000 MG-UNIT Caps Take 1 capsule by mouth Nightly.   FLUoxetine 20 MG capsule Commonly known as: PROZAC Take 20 mg by mouth at bedtime.   fluticasone 50 MCG/ACT nasal spray Commonly known as: FLONASE Place 1 spray into both nostrils daily as needed for allergies or rhinitis.   hydrochlorothiazide 25 MG tablet Commonly known as: HYDRODIURIL Take 25 mg by mouth daily.   RA Fish Oil 1000 MG Caps Take 1 capsule by mouth daily.        Allergies:  Allergies  Allergen Reactions   Penicillins    Zosyn [Piperacillin-Tazobactam In Dex]     Family History: No family history on file.  Social History:  reports that he has never smoked. He has never used smokeless tobacco. He reports that he does not drink alcohol and does not use drugs.   Physical Exam: BP (!) 179/99   Pulse 90   Ht 6' (1.829 m)   Wt 210 lb (95.3 kg)   BMI 28.48 kg/m   Constitutional:  Alert and oriented, No acute distress. HEENT: [place] AT,  moist mucus membranes.  Trachea midline, no masses. Cardiovascular: No clubbing, cyanosis, or edema. Respiratory: Normal respiratory effort, no increased work of breathing. GI: Abdomen is soft, nontender, nondistended, no abdominal masses Psychiatric: Normal mood and affect.   Assessment & Plan:    1. Nephrolithiasis Asymptomatic KUB ordered today  2. Hypogonadism Recheck testosterone  3.  Prostate cancer screening PSA ordered   [name], MD  [facility] [address], [address] [place], [place] [address] [phone]

## 2020-09-14 ENCOUNTER — Encounter: Payer: Self-pay | Admitting: *Deleted

## 2020-09-14 ENCOUNTER — Other Ambulatory Visit: Payer: Self-pay | Admitting: Urology

## 2020-09-14 DIAGNOSIS — N200 Calculus of kidney: Secondary | ICD-10-CM

## 2020-09-18 ENCOUNTER — Encounter: Payer: Self-pay | Admitting: *Deleted

## 2020-09-24 ENCOUNTER — Telehealth: Payer: Self-pay | Admitting: Urology

## 2020-09-24 NOTE — Telephone Encounter (Signed)
Advised patient to have a ct scan done and we will call with results

## 2020-09-24 NOTE — Telephone Encounter (Signed)
This patient would like someone to give him a call to clarify results in his MyChart from his last visit.

## 2020-09-24 NOTE — Telephone Encounter (Addendum)
Left message for patient to call us back.  

## 2020-11-02 ENCOUNTER — Other Ambulatory Visit: Payer: Self-pay

## 2020-11-02 ENCOUNTER — Ambulatory Visit
Admission: RE | Admit: 2020-11-02 | Discharge: 2020-11-02 | Disposition: A | Discharge status: Home / Self Care | Payer: 59 | Source: Ambulatory Visit | Attending: Urology | Admitting: Urology

## 2020-11-02 DIAGNOSIS — N200 Calculus of kidney: Secondary | ICD-10-CM | POA: Insufficient documentation

## 2020-11-07 ENCOUNTER — Other Ambulatory Visit: Payer: Self-pay | Admitting: *Deleted

## 2020-11-07 ENCOUNTER — Encounter: Payer: Self-pay | Admitting: *Deleted

## 2020-11-07 DIAGNOSIS — N200 Calculus of kidney: Secondary | ICD-10-CM

## 2020-12-24 ENCOUNTER — Encounter: Payer: Self-pay | Admitting: Urology

## 2021-02-01 DIAGNOSIS — Z20822 Contact with and (suspected) exposure to covid-19: Secondary | ICD-10-CM | POA: Diagnosis not present

## 2021-02-11 ENCOUNTER — Ambulatory Visit: Payer: 59 | Admitting: Urology

## 2021-02-13 ENCOUNTER — Ambulatory Visit: Payer: 59 | Admitting: Urology

## 2021-02-14 ENCOUNTER — Ambulatory Visit
Admission: RE | Admit: 2021-02-14 | Discharge: 2021-02-14 | Disposition: A | Discharge status: Home / Self Care | Payer: 59 | Attending: Urology | Admitting: Urology

## 2021-02-14 ENCOUNTER — Ambulatory Visit
Admission: RE | Admit: 2021-02-14 | Discharge: 2021-02-14 | Disposition: A | Discharge status: Home / Self Care | Payer: 59 | Source: Ambulatory Visit | Attending: Urology | Admitting: Urology

## 2021-02-14 DIAGNOSIS — N200 Calculus of kidney: Secondary | ICD-10-CM | POA: Diagnosis not present

## 2021-02-17 ENCOUNTER — Telehealth: Payer: Self-pay | Admitting: Urology

## 2021-02-17 NOTE — Telephone Encounter (Signed)
KUB reviewed and stone still present in the left distal ureter.  Based on the time it has been there and likely will pass.  Treatment is recommended as the stone can grow and eventually obstruct the kidney.  Can schedule telephone visit to discuss further

## 2021-02-18 ENCOUNTER — Encounter: Payer: Self-pay | Admitting: *Deleted

## 2021-03-06 ENCOUNTER — Ambulatory Visit: Payer: Self-pay | Admitting: Urology

## 2021-03-08 ENCOUNTER — Ambulatory Visit (INDEPENDENT_AMBULATORY_CARE_PROVIDER_SITE_OTHER): Payer: 59 | Admitting: Urology

## 2021-03-08 ENCOUNTER — Encounter: Payer: Self-pay | Admitting: Urology

## 2021-03-08 ENCOUNTER — Other Ambulatory Visit: Payer: Self-pay

## 2021-03-08 VITALS — BP 169/85 | HR 71 | Ht 72.0 in | Wt 210.0 lb

## 2021-03-08 DIAGNOSIS — N201 Calculus of ureter: Secondary | ICD-10-CM | POA: Diagnosis not present

## 2021-03-08 DIAGNOSIS — E291 Testicular hypofunction: Secondary | ICD-10-CM | POA: Diagnosis not present

## 2021-03-08 DIAGNOSIS — N200 Calculus of kidney: Secondary | ICD-10-CM

## 2021-03-08 MED ORDER — TAMSULOSIN HCL 0.4 MG PO CAPS: 0.4000 mg | ORAL_CAPSULE | Freq: Every day | ORAL | 1 refills | Status: DC

## 2021-03-08 NOTE — Progress Notes (Signed)
03/08/2021 1:17 PM   Jonathan Carter 10-Dec-1960 [id number]  Referring provider: Leonel Ramsay, MD [place],  [place] [address]  Chief Complaint  Patient presents with   Nephrolithiasis    HPI: 61 y.o. male who presents for follow-up visit.  CT 10/2020 showed migration of a 5 mm calculus to the left distal ureter Follow-up KUB earlier this month showed persistence of stone He is asymptomatic and denies flank, abdominal or pelvic pain   PMH: Past Medical History:  Diagnosis Date   Anxiety    panic attacks   Endocarditis 2015   Headache    migraines   Hypertension    no medication   Kidney stones 2015    Surgical History: Past Surgical History:  Procedure Laterality Date   COLONOSCOPY WITH PROPOFOL N/A 04/06/2017   Procedure: COLONOSCOPY WITH PROPOFOL;  Surgeon: Manya Silvas, MD;  Location: [facility] ENDOSCOPY;  Service: Endoscopy;  Laterality: N/A;   SHOULDER ARTHROSCOPY WITH OPEN ROTATOR CUFF REPAIR Right 09/06/2014   Procedure: SHOULDER ARTHROSCOPY WITH OPEN ROTATOR CUFF REPAIR;  Surgeon: Thornton Park, MD;  Location: [facility] ORS;  Service: Orthopedics;  Laterality: Right;    Home Medications:  Allergies as of 03/08/2021       Reactions   Penicillins    Zosyn [piperacillin-tazobactam In Dex]         Medication List        Accurate as of March 08, 2021  1:17 PM. If you have any questions, ask your nurse or doctor.          aspirin EC 81 MG tablet Take 81 mg by mouth Nightly.   CENTRUM SILVER PO Take 1 tablet by mouth at bedtime.   Cholecalciferol 50 MCG (2000 UT) Caps Take by mouth.   Fish Oil + D3 1200-1000 MG-UNIT Caps Take 1 capsule by mouth Nightly.   FLUoxetine 20 MG capsule Commonly known as: PROZAC Take 20 mg by mouth at bedtime.   fluticasone 50 MCG/ACT nasal spray Commonly known as: FLONASE Place 1 spray into both nostrils daily as needed for allergies or rhinitis.   hydrochlorothiazide 25 MG  tablet Commonly known as: HYDRODIURIL Take 25 mg by mouth daily.   RA Fish Oil 1000 MG Caps Take 1 capsule by mouth daily.        Allergies:  Allergies  Allergen Reactions   Penicillins    Zosyn [Piperacillin-Tazobactam In Dex]     Family History: No family history on file.  Social History:  reports that he has never smoked. He has never used smokeless tobacco. He reports that he does not drink alcohol and does not use drugs.   Physical Exam: BP (!) 169/85    Pulse 71    Ht 6' (1.829 m)    Wt 210 lb (95.3 kg)    BMI 28.48 kg/m   Constitutional:  Alert and oriented, No acute distress. HEENT: [place] AT, moist mucus membranes.  Trachea midline, no masses. Cardiovascular: No clubbing, cyanosis, or edema. Respiratory: Normal respiratory effort, no increased work of breathing. Psychiatric: Normal mood and affect.  Pertinent Imaging: CT and KUB images were personally reviewed and interpreted   CT RENAL STONE STUDY  Narrative CLINICAL DATA:  Nephrolithiasis. Possible distal left ureteral calculus. Currently asymptomatic.  EXAM: CT ABDOMEN AND PELVIS WITHOUT CONTRAST  TECHNIQUE: Multidetector CT imaging of the abdomen and pelvis was performed following the standard protocol without IV contrast.  COMPARISON:  Abdominopelvic CT 10/31/2013. Radiographs 09/02/2019 and 09/12/2020.  FINDINGS: Lower  chest: 4 mm left lower lobe nodule on image 2/3. This area was not previously imaged, and this is not definitely new. This is likely benign. The lung bases are otherwise clear. No significant pleural or pericardial effusion.  Hepatobiliary: The liver demonstrates diffusely decreased density consistent with steatosis. No focal abnormality identified on noncontrast imaging. No evidence of gallstones, gallbladder wall thickening or biliary dilatation.  Pancreas: Unremarkable. No pancreatic ductal dilatation or surrounding inflammatory changes.  Spleen: Normal in size without  focal abnormality.  Adrenals/Urinary Tract: Both adrenal glands appear normal. There are numerous small nonobstructing bilateral renal calculi which have increased in number compared with the remote CT. There is no hydronephrosis or perinephric soft tissue stranding. However, there is a 5 mm calculus at the left ureterovesical junction (image 76/2). This appears faintly visible on the scout image. There is an adjacent calcification laterally which appears to be within the bladder lumen (image 76/2). Two low-density cysts within the lower pole of the right kidney have enlarged compared with the remote CT. The bladder otherwise appears unremarkable.  Stomach/Bowel: No enteric contrast administered. The stomach appears unremarkable for its degree of distension. No evidence of bowel wall thickening, distention or surrounding inflammatory change. The appendix appears normal. There are diverticular changes within the sigmoid colon.  Vascular/Lymphatic: There are no enlarged abdominal or pelvic lymph nodes. Small lymph nodes in the porta hepatis are stable. There is mild aortic and branch vessel atherosclerosis. Circumaortic left renal vein noted.  Reproductive: The prostate gland is moderately enlarged, increased in size from remote CT. The seminal vesicles appear unremarkable.  Other: Tiny umbilical hernia containing only fat. The abdominal wall is otherwise intact. No ascites.  Musculoskeletal: No acute or significant osseous findings. Mild thoracolumbar spondylosis.  IMPRESSION: 1. 5 mm nonobstructing calculus at the left ureterovesical junction. Adjacent smaller calcification appears to be within the bladder lumen. No hydronephrosis or perinephric soft tissue stranding. 2. Numerous nonobstructing bilateral renal calculi, increased in number from remote abdominal CT. 3. Small cyst in the lower pole of the right kidney have enlarged from remote CT. 4. Progressive  prostatomegaly. 5. Additional incidental findings including distal colonic diverticulosis, hepatic steatosis, spondylosis and Aortic Atherosclerosis (ICD10-I70.0). 6. 4 mm left lower lobe pulmonary nodule, not previously imaged. This appearance is almost certainly benign, and no dedicated follow-up is required if this patient is low risk for bronchogenic carcinoma (and has no known or suspected primary neoplasm). Non-contrast chest CT can be considered in 12 months if patient is high-risk. This recommendation follows the consensus statement: Guidelines for Management of Incidental Pulmonary Nodules Detected on CT Images: From the Fleischner Society 2017; Radiology 2017; 284:228-243.   Electronically Signed By: Richardean Sale M.D. On: 11/05/2020 12:35   Assessment & Plan:    1.  Left distal ureteral calculus Nonobstructing left distal ureteral calculus We discussed that treatment of persistent ureteral stones even if nonobstructing is recommended due to the possibility of continued growth and silent obstruction We discussed various treatment options a distal ureteral calculus including shockwave lithotripsy (SWL), ureteroscopy and laser lithotripsy with stent placement. SWL has a lower stone free rate in a single procedure, but also a lower complication rate compared to ureteroscopy and avoids a stent and associated stent related symptoms. Possible complications include renal hematoma, steinstrasse, and need for additional treatment. Ureteroscopy with laser lithotripsy and stent placement has a higher stone free rate than SWL in a single procedure, however increased complication rate including possible infection, ureteral injury, bleeding, and stent related  morbidity. Common stent related symptoms include dysuria, urgency/frequency, and flank pain. After an extensive discussion of the risks and benefits of the above treatment options, the patient would like to proceed with SWL however  would prefer to wait 4-6 weeks to see if the stone will eventually pass. He has not been on MET and Rx tamsulosin was sent to pharmacy  2.  Hypogonadism Last 3 testosterone levels have been abnormally low Review of previous records remarkable for low normal LH and he would be a candidate for clomiphene.  We discussed this is not effective but off label treatment for low testosterone He would like to discuss with his PCP and cardiologist regarding any contraindications   Abbie Sons, MD  [place] [address], [place], [place] [address] [phone]

## 2021-03-10 ENCOUNTER — Encounter: Payer: Self-pay | Admitting: Urology

## 2021-04-08 ENCOUNTER — Ambulatory Visit
Admission: RE | Admit: 2021-04-08 | Discharge: 2021-04-08 | Disposition: A | Discharge status: Home / Self Care | Payer: 59 | Source: Ambulatory Visit | Attending: Urology | Admitting: Urology

## 2021-04-08 ENCOUNTER — Ambulatory Visit
Admission: RE | Admit: 2021-04-08 | Discharge: 2021-04-08 | Disposition: A | Discharge status: Home / Self Care | Payer: 59 | Attending: Urology | Admitting: Urology

## 2021-04-08 DIAGNOSIS — N201 Calculus of ureter: Secondary | ICD-10-CM

## 2021-04-08 DIAGNOSIS — N200 Calculus of kidney: Secondary | ICD-10-CM | POA: Diagnosis not present

## 2021-04-09 DIAGNOSIS — J40 Bronchitis, not specified as acute or chronic: Secondary | ICD-10-CM | POA: Diagnosis not present

## 2021-04-14 ENCOUNTER — Encounter: Payer: Self-pay | Admitting: Urology

## 2021-04-15 ENCOUNTER — Telehealth: Payer: Self-pay | Admitting: *Deleted

## 2021-04-15 NOTE — Telephone Encounter (Signed)
Patient states he will do the lithotripsy  ?

## 2021-04-17 ENCOUNTER — Other Ambulatory Visit: Payer: Self-pay | Admitting: Urology

## 2021-04-17 DIAGNOSIS — N201 Calculus of ureter: Secondary | ICD-10-CM

## 2021-04-17 NOTE — Telephone Encounter (Signed)
Orders sent

## 2021-04-17 NOTE — Progress Notes (Signed)
[facility] ORDER FORM ? ?Expected date of procedure: Pt pref ? ?Surgeon:  TBD ? ?Post op standing: 2-4wk follow up w/KUB prior ? ?Anticoagulation/Aspirin/NSAID standing order: Hold all 72 hours prior ? ?Anesthesia standing order: MAC ? ?VTE standing: SCD's ? ?Dx: Left Ureteral Stone ? ?Procedure: Left Extracorporeal shock wave lithotripsy ? ?CPT : [phone] ? ?Standing Order Set:  ? ?*NPO after mn, KUB ? ?*NS 114m/hr, Keflex 5084mPO, Benadryl 2565mO, Valium 51m61m, Zofran 4mg 70m? ?*UA ? ?Medications if other than standing orders:   NONE ? ? ?

## 2021-04-24 ENCOUNTER — Other Ambulatory Visit: Payer: Self-pay

## 2021-04-24 DIAGNOSIS — N201 Calculus of ureter: Secondary | ICD-10-CM

## 2021-04-25 ENCOUNTER — Other Ambulatory Visit: Payer: 59

## 2021-04-25 DIAGNOSIS — N201 Calculus of ureter: Secondary | ICD-10-CM

## 2021-04-25 LAB — URINALYSIS, COMPLETE
Bilirubin, UA: NEGATIVE
Glucose, UA: NEGATIVE
Ketones, UA: NEGATIVE
Nitrite, UA: NEGATIVE
Protein,UA: NEGATIVE
RBC, UA: NEGATIVE
Specific Gravity, UA: 1.02 (ref 1.005–1.030)
Urobilinogen, Ur: 0.2 mg/dL (ref 0.2–1.0)
pH, UA: 7 (ref 5.0–7.5)

## 2021-04-25 LAB — MICROSCOPIC EXAMINATION: Bacteria, UA: NONE SEEN

## 2021-04-29 LAB — CULTURE, URINE COMPREHENSIVE

## 2021-05-13 ENCOUNTER — Other Ambulatory Visit: Payer: 59

## 2021-05-13 ENCOUNTER — Ambulatory Visit
Admission: RE | Admit: 2021-05-13 | Discharge: 2021-05-13 | Disposition: A | Discharge status: Home / Self Care | Payer: 59 | Attending: Urology | Admitting: Urology

## 2021-05-13 ENCOUNTER — Other Ambulatory Visit: Payer: Self-pay

## 2021-05-13 ENCOUNTER — Ambulatory Visit
Admission: RE | Admit: 2021-05-13 | Discharge: 2021-05-13 | Disposition: A | Discharge status: Home / Self Care | Payer: 59 | Source: Ambulatory Visit | Attending: Urology | Admitting: Urology

## 2021-05-13 DIAGNOSIS — I878 Other specified disorders of veins: Secondary | ICD-10-CM | POA: Diagnosis not present

## 2021-05-13 DIAGNOSIS — N200 Calculus of kidney: Secondary | ICD-10-CM

## 2021-05-13 LAB — MICROSCOPIC EXAMINATION: Bacteria, UA: NONE SEEN

## 2021-05-13 LAB — URINALYSIS, COMPLETE
Bilirubin, UA: NEGATIVE
Glucose, UA: NEGATIVE
Ketones, UA: NEGATIVE
Nitrite, UA: NEGATIVE
Protein,UA: NEGATIVE
Specific Gravity, UA: 1.025 (ref 1.005–1.030)
Urobilinogen, Ur: 0.2 mg/dL (ref 0.2–1.0)
pH, UA: 6 (ref 5.0–7.5)

## 2021-05-16 LAB — CULTURE, URINE COMPREHENSIVE

## 2021-05-20 ENCOUNTER — Inpatient Hospital Stay: Admission: RE | Admit: 2021-05-20 | Payer: 59 | Source: Ambulatory Visit

## 2021-05-23 ENCOUNTER — Ambulatory Visit: Admission: RE | Admit: 2021-05-23 | Payer: 59 | Source: Home / Self Care | Admitting: Urology

## 2021-05-23 ENCOUNTER — Encounter: Admission: RE | Payer: Self-pay | Source: Home / Self Care

## 2021-05-23 SURGERY — LITHOTRIPSY, ESWL
Anesthesia: Moderate Sedation | Laterality: Left

## 2021-05-28 ENCOUNTER — Ambulatory Visit: Payer: 59 | Admitting: Physician Assistant

## 2021-05-28 ENCOUNTER — Other Ambulatory Visit: Payer: Self-pay | Admitting: *Deleted

## 2021-05-28 DIAGNOSIS — N200 Calculus of kidney: Secondary | ICD-10-CM

## 2021-06-20 ENCOUNTER — Ambulatory Visit (INDEPENDENT_AMBULATORY_CARE_PROVIDER_SITE_OTHER): Payer: 59 | Admitting: Urology

## 2021-06-20 ENCOUNTER — Encounter: Payer: Self-pay | Admitting: Urology

## 2021-06-20 VITALS — BP 166/92 | HR 71 | Ht 72.0 in | Wt 210.0 lb

## 2021-06-20 DIAGNOSIS — R300 Dysuria: Secondary | ICD-10-CM

## 2021-06-20 DIAGNOSIS — Z87442 Personal history of urinary calculi: Secondary | ICD-10-CM

## 2021-06-20 DIAGNOSIS — E291 Testicular hypofunction: Secondary | ICD-10-CM

## 2021-06-20 DIAGNOSIS — N200 Calculus of kidney: Secondary | ICD-10-CM | POA: Diagnosis not present

## 2021-06-20 LAB — URINALYSIS, COMPLETE
Bilirubin, UA: NEGATIVE
Glucose, UA: NEGATIVE
Ketones, UA: NEGATIVE
Leukocytes,UA: NEGATIVE
Nitrite, UA: NEGATIVE
Protein,UA: NEGATIVE
Specific Gravity, UA: 1.02 (ref 1.005–1.030)
Urobilinogen, Ur: 0.2 mg/dL (ref 0.2–1.0)
pH, UA: 6 (ref 5.0–7.5)

## 2021-06-20 LAB — MICROSCOPIC EXAMINATION: Bacteria, UA: NONE SEEN

## 2021-06-20 MED ORDER — CLOMIPHENE CITRATE 50 MG PO TABS: 25.0000 mg | ORAL_TABLET | Freq: Every day | ORAL | 1 refills | Status: DC

## 2021-06-20 NOTE — Progress Notes (Signed)
06/20/2021 12:08 PM   Jonathan Carter Feb 02, 1960 [id number]  Referring provider: Mick Sell, MD [address] [place],  [place] [address]  Chief Complaint  Patient presents with   Hypogonadism    1.  Nephrolithiasis             -Bilateral nonobstructing renal calculi   2.  History hypogonadism             -Previously on TRT  HPI: 61 y.o. male presents to discuss testosterone replacement.  Was scheduled for SWL of a 5 mm nonobstructing distal ureteral calculus which he subsequently passed Testosterone level has been in the abnormal range and he does have symptoms of tiredness, fatigue Interested in restarting testosterone replacement LH level has been midrange normal Has noted some mild dysuria intermittently the past few days  PMH: Past Medical History:  Diagnosis Date   Anxiety    panic attacks   Endocarditis 2015   Headache    migraines   Hypertension    no medication   Kidney stones 2015    Surgical History: Past Surgical History:  Procedure Laterality Date   COLONOSCOPY WITH PROPOFOL N/A 04/06/2017   Procedure: COLONOSCOPY WITH PROPOFOL;  Surgeon: Scot Jun, MD;  Location: [facility] ENDOSCOPY;  Service: Endoscopy;  Laterality: N/A;   SHOULDER ARTHROSCOPY WITH OPEN ROTATOR CUFF REPAIR Right 09/06/2014   Procedure: SHOULDER ARTHROSCOPY WITH OPEN ROTATOR CUFF REPAIR;  Surgeon: Juanell Fairly, MD;  Location: [facility] ORS;  Service: Orthopedics;  Laterality: Right;    Home Medications:  Allergies as of 06/20/2021       Reactions   Penicillins    Zosyn [piperacillin-tazobactam In Dex]         Medication List        Accurate as of June 20, 2021 12:08 PM. If you have any questions, ask your nurse or doctor.          aspirin EC 81 MG tablet Take 81 mg by mouth Nightly.   CENTRUM SILVER PO Take 1 tablet by mouth at bedtime.   Cholecalciferol 50 MCG (2000 UT) Caps Take by mouth.   clomiPHENE 50 MG tablet Commonly known as:  CLOMID Take 0.5 tablets (25 mg total) by mouth daily. Started by: [name], MD   Fish Oil + D3 1200-1000 MG-UNIT Caps Take 1 capsule by mouth Nightly.   FLUoxetine 20 MG capsule Commonly known as: PROZAC Take 20 mg by mouth at bedtime.   fluticasone 50 MCG/ACT nasal spray Commonly known as: FLONASE Place 1 spray into both nostrils daily as needed for allergies or rhinitis.   hydrochlorothiazide 25 MG tablet Commonly known as: HYDRODIURIL Take 25 mg by mouth daily.   RA Fish Oil 1000 MG Caps Take 1 capsule by mouth daily.   tamsulosin 0.4 MG Caps capsule Commonly known as: FLOMAX Take 1 capsule (0.4 mg total) by mouth daily.        Allergies:  Allergies  Allergen Reactions   Penicillins    Zosyn [Piperacillin-Tazobactam In Dex]     Family History: History reviewed. No pertinent family history.  Social History:  reports that he has never smoked. He has never used smokeless tobacco. He reports that he does not drink alcohol and does not use drugs.   Physical Exam: BP (!) 166/92   Pulse 71   Ht 6' (1.829 m)   Wt 210 lb (95.3 kg)   BMI 28.48 kg/m   Constitutional:  Alert and oriented, No acute distress. HEENT:  [place] AT Respiratory: Normal respiratory effort, no increased work of breathing. Psychiatric: Normal mood and affect.  Laboratory Data:  Urinalysis Dipstick trace blood Microscopy negative   Assessment & Plan:    1. Hypogonadism in male LH has been midrange normal and we discussed the option of Clomid We discussed this medication is commonly used off label but has been used for decades without any significant side effects noted He was interested in a trial and Rx sent to pharmacy Schedule lab visit 6 weeks for repeat testosterone level  2.  Dysuria Urinalysis today is clear  [name], MD  [facility] [address], [address] [place], [place] [address] [phone]

## 2021-06-21 ENCOUNTER — Encounter: Payer: Self-pay | Admitting: *Deleted

## 2021-06-25 ENCOUNTER — Encounter: Payer: Self-pay | Admitting: Urology

## 2021-06-28 NOTE — Telephone Encounter (Signed)
Spoke with [facility] will fill Clomid 25 mg capsules and contact the pharmacy.

## 2021-07-08 ENCOUNTER — Telehealth: Payer: Self-pay | Admitting: Urology

## 2021-08-12 ENCOUNTER — Encounter: Payer: Self-pay | Admitting: Urology

## 2021-08-28 DIAGNOSIS — S0101XA Laceration without foreign body of scalp, initial encounter: Secondary | ICD-10-CM | POA: Diagnosis not present

## 2021-09-12 ENCOUNTER — Ambulatory Visit: Payer: 59 | Admitting: Urology

## 2021-09-25 ENCOUNTER — Other Ambulatory Visit: Payer: Self-pay | Admitting: *Deleted

## 2021-09-25 DIAGNOSIS — E291 Testicular hypofunction: Secondary | ICD-10-CM

## 2021-10-03 ENCOUNTER — Other Ambulatory Visit: Payer: 59

## 2021-10-03 DIAGNOSIS — E291 Testicular hypofunction: Secondary | ICD-10-CM

## 2021-10-04 ENCOUNTER — Encounter: Payer: Self-pay | Admitting: Urology

## 2021-10-04 LAB — TESTOSTERONE: Testosterone: 424 ng/dL (ref 264–916)

## 2021-10-07 ENCOUNTER — Other Ambulatory Visit: Payer: Self-pay | Admitting: *Deleted

## 2021-10-07 ENCOUNTER — Encounter: Payer: Self-pay | Admitting: *Deleted

## 2021-10-07 MED ORDER — CLOMIPHENE CITRATE 50 MG PO TABS
25.0000 mg | ORAL_TABLET | Freq: Every day | ORAL | 1 refills | Status: DC
Start: 2021-10-07 — End: 2021-10-09

## 2021-10-09 ENCOUNTER — Other Ambulatory Visit: Payer: Self-pay | Admitting: Family Medicine

## 2021-10-09 MED ORDER — CLOMID 50 MG PO TABS: ORAL_TABLET | ORAL | 0 refills | Status: DC

## 2021-10-24 DIAGNOSIS — Z23 Encounter for immunization: Secondary | ICD-10-CM | POA: Diagnosis not present

## 2021-11-22 ENCOUNTER — Encounter: Payer: Self-pay | Admitting: Urology

## 2021-11-25 ENCOUNTER — Telehealth: Payer: Self-pay | Admitting: Family Medicine

## 2021-11-25 DIAGNOSIS — E7800 Pure hypercholesterolemia, unspecified: Secondary | ICD-10-CM | POA: Diagnosis not present

## 2021-11-25 DIAGNOSIS — Z0000 Encounter for general adult medical examination without abnormal findings: Secondary | ICD-10-CM | POA: Diagnosis not present

## 2021-11-25 DIAGNOSIS — Z125 Encounter for screening for malignant neoplasm of prostate: Secondary | ICD-10-CM | POA: Diagnosis not present

## 2021-11-25 DIAGNOSIS — I10 Essential (primary) hypertension: Secondary | ICD-10-CM | POA: Diagnosis not present

## 2021-11-25 MED ORDER — CLOMID 50 MG PO TABS: ORAL_TABLET | ORAL | 0 refills | Status: DC

## 2021-11-25 NOTE — Telephone Encounter (Signed)
Spoke to patient and scheduled the lab and follow up. Clomid was sent to pharmacy.

## 2021-12-25 DIAGNOSIS — I351 Nonrheumatic aortic (valve) insufficiency: Secondary | ICD-10-CM | POA: Diagnosis not present

## 2021-12-25 DIAGNOSIS — E7800 Pure hypercholesterolemia, unspecified: Secondary | ICD-10-CM | POA: Diagnosis not present

## 2021-12-25 DIAGNOSIS — I10 Essential (primary) hypertension: Secondary | ICD-10-CM | POA: Diagnosis not present

## 2021-12-25 DIAGNOSIS — I358 Other nonrheumatic aortic valve disorders: Secondary | ICD-10-CM | POA: Diagnosis not present

## 2022-01-02 DIAGNOSIS — I351 Nonrheumatic aortic (valve) insufficiency: Secondary | ICD-10-CM | POA: Diagnosis not present

## 2022-01-21 DIAGNOSIS — I358 Other nonrheumatic aortic valve disorders: Secondary | ICD-10-CM | POA: Diagnosis not present

## 2022-01-21 DIAGNOSIS — I10 Essential (primary) hypertension: Secondary | ICD-10-CM | POA: Diagnosis not present

## 2022-01-21 DIAGNOSIS — E7800 Pure hypercholesterolemia, unspecified: Secondary | ICD-10-CM | POA: Diagnosis not present

## 2022-01-21 DIAGNOSIS — I351 Nonrheumatic aortic (valve) insufficiency: Secondary | ICD-10-CM | POA: Diagnosis not present

## 2022-02-14 ENCOUNTER — Other Ambulatory Visit: Payer: Self-pay | Admitting: *Deleted

## 2022-02-14 ENCOUNTER — Telehealth: Payer: Self-pay | Admitting: Urology

## 2022-02-14 MED ORDER — CLOMID 50 MG PO TABS: ORAL_TABLET | ORAL | 0 refills | Status: DC

## 2022-02-14 NOTE — Telephone Encounter (Signed)
Pt called and need Clomid 50 mg 3 month supply to [facility] [address], the only pharmacy that as it.   NOT to [place] in [place].  Please confirm with pt [phone]

## 2022-02-14 NOTE — Telephone Encounter (Signed)
[facility] and patient advised

## 2022-03-28 ENCOUNTER — Other Ambulatory Visit: Payer: 59

## 2022-03-31 ENCOUNTER — Ambulatory Visit: Payer: 59 | Admitting: Urology

## 2022-04-08 ENCOUNTER — Other Ambulatory Visit: Payer: Self-pay | Admitting: Family Medicine

## 2022-04-08 DIAGNOSIS — E291 Testicular hypofunction: Secondary | ICD-10-CM

## 2022-04-08 DIAGNOSIS — Z125 Encounter for screening for malignant neoplasm of prostate: Secondary | ICD-10-CM

## 2022-04-09 ENCOUNTER — Other Ambulatory Visit: Payer: 59

## 2022-04-09 DIAGNOSIS — Z125 Encounter for screening for malignant neoplasm of prostate: Secondary | ICD-10-CM

## 2022-04-09 DIAGNOSIS — E291 Testicular hypofunction: Secondary | ICD-10-CM

## 2022-04-10 LAB — HEMATOCRIT: Hematocrit: 46.2 % (ref 37.5–51.0)

## 2022-04-10 LAB — PSA: Prostate Specific Ag, Serum: 4.2 ng/mL — ABNORMAL HIGH (ref 0.0–4.0)

## 2022-04-10 LAB — TESTOSTERONE: Testosterone: 342 ng/dL (ref 264–916)

## 2022-04-17 ENCOUNTER — Other Ambulatory Visit: Payer: 59

## 2022-04-23 ENCOUNTER — Encounter: Payer: Self-pay | Admitting: Urology

## 2022-04-23 ENCOUNTER — Ambulatory Visit
Admission: RE | Admit: 2022-04-23 | Discharge: 2022-04-23 | Disposition: A | Discharge status: Home / Self Care | Payer: 59 | Attending: Physician Assistant | Admitting: Physician Assistant

## 2022-04-23 ENCOUNTER — Ambulatory Visit
Admission: RE | Admit: 2022-04-23 | Discharge: 2022-04-23 | Disposition: A | Discharge status: Home / Self Care | Payer: 59 | Source: Ambulatory Visit | Attending: Physician Assistant | Admitting: Physician Assistant

## 2022-04-23 ENCOUNTER — Ambulatory Visit: Payer: 59 | Admitting: Urology

## 2022-04-23 VITALS — BP 170/88 | HR 82 | Ht 72.0 in | Wt 210.0 lb

## 2022-04-23 DIAGNOSIS — N200 Calculus of kidney: Secondary | ICD-10-CM | POA: Insufficient documentation

## 2022-04-23 DIAGNOSIS — N522 Drug-induced erectile dysfunction: Secondary | ICD-10-CM | POA: Diagnosis not present

## 2022-04-23 DIAGNOSIS — Z125 Encounter for screening for malignant neoplasm of prostate: Secondary | ICD-10-CM

## 2022-04-23 DIAGNOSIS — H938X9 Other specified disorders of ear, unspecified ear: Secondary | ICD-10-CM | POA: Diagnosis not present

## 2022-04-23 DIAGNOSIS — H903 Sensorineural hearing loss, bilateral: Secondary | ICD-10-CM | POA: Diagnosis not present

## 2022-04-23 DIAGNOSIS — R972 Elevated prostate specific antigen [PSA]: Secondary | ICD-10-CM

## 2022-04-23 DIAGNOSIS — H6983 Other specified disorders of Eustachian tube, bilateral: Secondary | ICD-10-CM | POA: Diagnosis not present

## 2022-04-23 DIAGNOSIS — E291 Testicular hypofunction: Secondary | ICD-10-CM | POA: Diagnosis not present

## 2022-04-23 MED ORDER — CLOMID 50 MG PO TABS: ORAL_TABLET | ORAL | 0 refills | Status: DC

## 2022-04-23 MED ORDER — SILDENAFIL CITRATE 100 MG PO TABS: ORAL_TABLET | ORAL | 0 refills | Status: DC

## 2022-04-23 NOTE — Progress Notes (Signed)
I, Jonathan Carter,acting as a scribe for Jonathan Altes, MD.,have documented all relevant documentation on the behalf of Jonathan Altes, MD,as directed by  Jonathan Altes, MD while in the presence of Jonathan Altes, MD.  04/23/2022 5:18 PM   Jonathan Carter February 05, 1960 [id number]  Referring provider: Mick Sell, MD [address] [place],  [place] [address]  Chief Complaint  Patient presents with   Nephrolithiasis   Urologic History: 1.  Nephrolithiasis             -Bilateral nonobstructing renal calculi   2.  History hypogonadism             -Previously on TRT             -Interested in trying Clomid 3.  Dysuria             -Urinalysis on 06/20/2021 was clear  HPI: 62 y.o. male presents to discuss testosterone replacement.  At last visit in June 2023 was started on Clomid for low testosterone.  Labs 04/09/2022: Testosterone 342. PSA 4.2. Hematocrit 46.2. Since his last visit has noted difficulty achieving and maintaining an erection since he started HCTZ for hypertension.   PMH: Past Medical History:  Diagnosis Date   Anxiety    panic attacks   Endocarditis 2015   Headache    migraines   Hypertension    no medication   Kidney stones 2015    Surgical History: Past Surgical History:  Procedure Laterality Date   COLONOSCOPY WITH PROPOFOL N/A 04/06/2017   Procedure: COLONOSCOPY WITH PROPOFOL;  Surgeon: Scot Jun, MD;  Location: [facility] ENDOSCOPY;  Service: Endoscopy;  Laterality: N/A;   SHOULDER ARTHROSCOPY WITH OPEN ROTATOR CUFF REPAIR Right 09/06/2014   Procedure: SHOULDER ARTHROSCOPY WITH OPEN ROTATOR CUFF REPAIR;  Surgeon: Juanell Fairly, MD;  Location: [facility] ORS;  Service: Orthopedics;  Laterality: Right;    Home Medications:  Allergies as of 04/23/2022       Reactions   Penicillins    Zosyn [piperacillin-tazobactam In Dex]         Medication List        Accurate as of April 23, 2022  5:18 PM. If you have any questions, ask your  nurse or doctor.          aspirin EC 81 MG tablet Take 81 mg by mouth Nightly.   CENTRUM SILVER PO Take 1 tablet by mouth at bedtime.   Cholecalciferol 50 MCG (2000 UT) Caps Take by mouth.   Clomid 50 MG tablet Generic drug: clomiPHENE Take 1/2 tablet daily   Fish Oil + D3 1200-1000 MG-UNIT Caps Take 1 capsule by mouth Nightly.   FLUoxetine 20 MG capsule Commonly known as: PROZAC Take 20 mg by mouth at bedtime.   fluticasone 50 MCG/ACT nasal spray Commonly known as: FLONASE Place 1 spray into both nostrils daily as needed for allergies or rhinitis.   hydrochlorothiazide 25 MG tablet Commonly known as: HYDRODIURIL Take 25 mg by mouth daily.   RA Fish Oil 1000 MG Caps Take 1 capsule by mouth daily.   sildenafil 100 MG tablet Commonly known as: VIAGRA Take 1 tab 1 hour prior to intercourse Started by: Jonathan Altes, MD   tamsulosin 0.4 MG Caps capsule Commonly known as: FLOMAX Take 1 capsule (0.4 mg total) by mouth daily.        Allergies:  Allergies  Allergen Reactions   Penicillins    Zosyn [Piperacillin-Tazobactam In Dex]  Social History:  reports that he has never smoked. He has never used smokeless tobacco. He reports that he does not drink alcohol and does not use drugs.   Physical Exam: BP (!) 170/88   Pulse 82   Ht 5\' 10"  (1.778 m)   Wt 230 lb (104.3 kg)   BMI 33.00 kg/m   Constitutional:  Alert and oriented, No acute distress. HEENT:  [place] AT GU: Prostate 60 grams, smooth without nodules. Respiratory: Normal respiratory effort, no increased work of breathing. Psychiatric: Normal mood and affect.   Assessment & Plan:    Bilateral nephrolithiasis Asymptomatic KUB stable bilateral renal calculi  2. Hypogonadism Symptoms have improved on Clomid.  His most recent testosterone level was lower than his prior level in the mid 400 range.   3. Elevated PSA Mild PSA elevation with benign DRE.  Return in three months for a DRE and  repeat testosterone  4. Erectile Dysfunction He was interested in a PD-5 inhibitor trial. Sent Rx Sildenafil 100 milligrams to pharmacy. We discussed dosing and the most common side effects of headache and flushing.  Continue annual follow-up.   [facility] [address] [place], [place] [address] [phone]

## 2022-06-13 DIAGNOSIS — K219 Gastro-esophageal reflux disease without esophagitis: Secondary | ICD-10-CM | POA: Diagnosis not present

## 2022-06-13 DIAGNOSIS — Z8679 Personal history of other diseases of the circulatory system: Secondary | ICD-10-CM | POA: Diagnosis not present

## 2022-06-13 DIAGNOSIS — R509 Fever, unspecified: Secondary | ICD-10-CM | POA: Diagnosis not present

## 2022-06-16 DIAGNOSIS — K219 Gastro-esophageal reflux disease without esophagitis: Secondary | ICD-10-CM | POA: Diagnosis not present

## 2022-06-16 DIAGNOSIS — Z8679 Personal history of other diseases of the circulatory system: Secondary | ICD-10-CM | POA: Diagnosis not present

## 2022-06-16 DIAGNOSIS — R509 Fever, unspecified: Secondary | ICD-10-CM | POA: Diagnosis not present

## 2022-06-20 DIAGNOSIS — K219 Gastro-esophageal reflux disease without esophagitis: Secondary | ICD-10-CM | POA: Diagnosis not present

## 2022-06-20 DIAGNOSIS — R195 Other fecal abnormalities: Secondary | ICD-10-CM | POA: Diagnosis not present

## 2022-06-20 DIAGNOSIS — Z8679 Personal history of other diseases of the circulatory system: Secondary | ICD-10-CM | POA: Diagnosis not present

## 2022-06-20 DIAGNOSIS — R3129 Other microscopic hematuria: Secondary | ICD-10-CM | POA: Diagnosis not present

## 2022-06-20 DIAGNOSIS — R509 Fever, unspecified: Secondary | ICD-10-CM | POA: Diagnosis not present

## 2022-07-24 ENCOUNTER — Other Ambulatory Visit: Payer: 59

## 2022-07-25 ENCOUNTER — Other Ambulatory Visit: Payer: 59

## 2022-07-25 DIAGNOSIS — E291 Testicular hypofunction: Secondary | ICD-10-CM

## 2022-07-25 DIAGNOSIS — Z125 Encounter for screening for malignant neoplasm of prostate: Secondary | ICD-10-CM

## 2022-07-26 LAB — PSA: Prostate Specific Ag, Serum: 4.7 ng/mL — ABNORMAL HIGH (ref 0.0–4.0)

## 2022-07-26 LAB — TESTOSTERONE: Testosterone: 410 ng/dL (ref 264–916)

## 2022-07-28 ENCOUNTER — Other Ambulatory Visit: Payer: Self-pay | Admitting: *Deleted

## 2022-07-28 DIAGNOSIS — R972 Elevated prostate specific antigen [PSA]: Secondary | ICD-10-CM

## 2022-07-29 ENCOUNTER — Telehealth: Payer: Self-pay | Admitting: *Deleted

## 2022-07-29 MED ORDER — DIAZEPAM 5 MG PO TABS: ORAL_TABLET | ORAL | 0 refills | Status: DC

## 2022-07-29 NOTE — Telephone Encounter (Signed)
Patient is asking about a valium for the prostate mri .  I advised him he would need a driver also .

## 2022-07-29 NOTE — Addendum Note (Signed)
Addended by: [name] on: 07/29/2022 01:21 PM   Modules accepted: Orders

## 2022-08-06 ENCOUNTER — Ambulatory Visit
Admission: RE | Admit: 2022-08-06 | Discharge: 2022-08-06 | Disposition: A | Discharge status: Home / Self Care | Payer: 59 | Source: Ambulatory Visit | Attending: Urology | Admitting: Urology

## 2022-08-06 DIAGNOSIS — R972 Elevated prostate specific antigen [PSA]: Secondary | ICD-10-CM | POA: Diagnosis not present

## 2022-08-06 DIAGNOSIS — N4289 Other specified disorders of prostate: Secondary | ICD-10-CM | POA: Diagnosis not present

## 2022-08-06 DIAGNOSIS — K5730 Diverticulosis of large intestine without perforation or abscess without bleeding: Secondary | ICD-10-CM | POA: Diagnosis not present

## 2022-08-06 MED ORDER — GADOBUTROL 1 MMOL/ML IV SOLN
9.0000 mL | Freq: Once | INTRAVENOUS | Status: AC | PRN
  Administered 2022-08-06: 9 mL via INTRAVENOUS

## 2022-08-15 ENCOUNTER — Other Ambulatory Visit: Payer: Self-pay

## 2022-08-15 DIAGNOSIS — E291 Testicular hypofunction: Secondary | ICD-10-CM

## 2022-08-15 DIAGNOSIS — R972 Elevated prostate specific antigen [PSA]: Secondary | ICD-10-CM

## 2022-08-27 DIAGNOSIS — E7800 Pure hypercholesterolemia, unspecified: Secondary | ICD-10-CM | POA: Diagnosis not present

## 2022-08-27 DIAGNOSIS — I351 Nonrheumatic aortic (valve) insufficiency: Secondary | ICD-10-CM | POA: Diagnosis not present

## 2022-08-27 DIAGNOSIS — I10 Essential (primary) hypertension: Secondary | ICD-10-CM | POA: Diagnosis not present

## 2022-10-13 DIAGNOSIS — R0981 Nasal congestion: Secondary | ICD-10-CM | POA: Diagnosis not present

## 2022-10-13 DIAGNOSIS — R058 Other specified cough: Secondary | ICD-10-CM | POA: Diagnosis not present

## 2022-10-13 DIAGNOSIS — J069 Acute upper respiratory infection, unspecified: Secondary | ICD-10-CM | POA: Diagnosis not present

## 2022-10-22 ENCOUNTER — Ambulatory Visit (INDEPENDENT_AMBULATORY_CARE_PROVIDER_SITE_OTHER): Payer: 59 | Admitting: Dermatology

## 2022-10-22 ENCOUNTER — Encounter: Payer: Self-pay | Admitting: Dermatology

## 2022-10-22 DIAGNOSIS — L578 Other skin changes due to chronic exposure to nonionizing radiation: Secondary | ICD-10-CM | POA: Diagnosis not present

## 2022-10-22 DIAGNOSIS — L814 Other melanin hyperpigmentation: Secondary | ICD-10-CM

## 2022-10-22 DIAGNOSIS — L821 Other seborrheic keratosis: Secondary | ICD-10-CM | POA: Diagnosis not present

## 2022-10-22 DIAGNOSIS — D2239 Melanocytic nevi of other parts of face: Secondary | ICD-10-CM | POA: Diagnosis not present

## 2022-10-22 DIAGNOSIS — W908XXA Exposure to other nonionizing radiation, initial encounter: Secondary | ICD-10-CM

## 2022-10-22 DIAGNOSIS — D229 Melanocytic nevi, unspecified: Secondary | ICD-10-CM

## 2022-10-22 DIAGNOSIS — Z1283 Encounter for screening for malignant neoplasm of skin: Secondary | ICD-10-CM

## 2022-10-22 NOTE — Patient Instructions (Addendum)
Recommend daily broad spectrum sunscreen SPF 30+ to sun-exposed areas, reapply every 2 hours as needed. Call for new or changing lesions.  Staying in the shade or wearing long sleeves, sun glasses (UVA+UVB protection) and wide brim hats (4-inch brim around the entire circumference of the hat) are also recommended for sun protection.    Seborrheic Keratosis  What causes seborrheic keratoses? Seborrheic keratoses are harmless, common skin growths that first appear during adult life.  As time goes by, more growths appear.  Some people may develop a large number of them.  Seborrheic keratoses appear on both covered and uncovered body parts.  They are not caused by sunlight.  The tendency to develop seborrheic keratoses can be inherited.  They vary in color from skin-colored to gray, brown, or even black.  They can be either smooth or have a rough, warty surface.   Seborrheic keratoses are superficial and look as if they were stuck on the skin.  Under the microscope this type of keratosis looks like layers upon layers of skin.  That is why at times the top layer may seem to fall off, but the rest of the growth remains and re-grows.    Treatment Seborrheic keratoses do not need to be treated, but can easily be removed in the office.  Seborrheic keratoses often cause symptoms when they rub on clothing or jewelry.  Lesions can be in the way of shaving.  If they become inflamed, they can cause itching, soreness, or burning.  Removal of a seborrheic keratosis can be accomplished by freezing, burning, or surgery. If any spot bleeds, scabs, or grows rapidly, please return to have it checked, as these can be an indication of a skin cancer.    Melanoma ABCDEs  Melanoma is the most dangerous type of skin cancer, and is the leading cause of death from skin disease.  You are more likely to develop melanoma if you: Have light-colored skin, light-colored eyes, or red or blond hair Spend a lot of time in the  sun Tan regularly, either outdoors or in a tanning bed Have had blistering sunburns, especially during childhood Have a close family member who has had a melanoma Have atypical moles or large birthmarks  Early detection of melanoma is key since treatment is typically straightforward and cure rates are extremely high if we catch it early.   The first sign of melanoma is often a change in a mole or a new dark spot.  The ABCDE system is a way of remembering the signs of melanoma.  A for asymmetry:  The two halves do not match. B for border:  The edges of the growth are irregular. C for color:  A mixture of colors are present instead of an even brown color. D for diameter:  Melanomas are usually (but not always) greater than 6mm - the size of a pencil eraser. E for evolution:  The spot keeps changing in size, shape, and color.  Please check your skin once per month between visits. You can use a small mirror in front and a large mirror behind you to keep an eye on the back side or your body.   If you see any new or changing lesions before your next follow-up, please call to schedule a visit.  Please continue daily skin protection including broad spectrum sunscreen SPF 30+ to sun-exposed areas, reapplying every 2 hours as needed when you're outdoors.   Staying in the shade or wearing long sleeves, sun glasses (UVA+UVB protection) and  wide brim hats (4-inch brim around the entire circumference of the hat) are also recommended for sun protection.    Due to recent changes in healthcare laws, you may see results of your pathology and/or laboratory studies on MyChart before the doctors have had a chance to review them. We understand that in some cases there may be results that are confusing or concerning to you. Please understand that not all results are received at the same time and often the doctors may need to interpret multiple results in order to provide you with the best plan of care or course of  treatment. Therefore, we ask that you please give [place] 2 business days to thoroughly review all your results before contacting the office for clarification. Should we see a critical lab result, you will be contacted sooner.   If You Need Anything After Your Visit  If you have any questions or concerns for your doctor, please call our main line at [phone] and press option 4 to reach your doctor's medical assistant. If no one answers, please leave a voicemail as directed and we will return your call as soon as possible. Messages left after 4 pm will be answered the following business day.   You may also send [place] a message via MyChart. We typically respond to MyChart messages within 1-2 business days.  For prescription refills, please ask your pharmacy to contact our office. Our fax number is [phone].  If you have an urgent issue when the clinic is closed that cannot wait until the next business day, you can page your doctor at the number below.    Please note that while we do our best to be available for urgent issues outside of office hours, we are not available 24/7.   If you have an urgent issue and are unable to reach [place], you may choose to seek medical care at your doctor's office, retail clinic, urgent care center, or emergency room.  If you have a medical emergency, please immediately call 911 or go to the emergency department.  Pager Numbers  - Dr. Gwen Pounds: [phone]  - Dr. Roseanne Reno: [phone]  - Dr. Katrinka Blazing: [phone]   In the event of inclement weather, please call our main line at [phone] for an update on the status of any delays or closures.  Dermatology Medication Tips: Please keep the boxes that topical medications come in in order to help keep track of the instructions about where and how to use these. Pharmacies typically print the medication instructions only on the boxes and not directly on the medication tubes.   If your medication is too expensive,  please contact our office at [phone] option 4 or send [place] a message through MyChart.   We are unable to tell what your co-pay for medications will be in advance as this is different depending on your insurance coverage. However, we may be able to find a substitute medication at lower cost or fill out paperwork to get insurance to cover a needed medication.   If a prior authorization is required to get your medication covered by your insurance company, please allow [place] 1-2 business days to complete this process.  Drug prices often vary depending on where the prescription is filled and some pharmacies may offer cheaper prices.  The website www.goodrx.com contains coupons for medications through different pharmacies. The prices here do not account for what the cost may be with help from insurance (it may be cheaper with your insurance), but the website can  give you the price if you did not use any insurance.  - You can print the associated coupon and take it with your prescription to the pharmacy.  - You may also stop by our office during regular business hours and pick up a GoodRx coupon card.  - If you need your prescription sent electronically to a different pharmacy, notify our office through [facility] or by phone at [phone] option 4.     Si Usted Necesita Algo Despus de Su Visita  Tambin puede enviarnos un mensaje a travs de Clinical cytogeneticist. Por lo general respondemos a los mensajes de MyChart en el transcurso de 1 a 2 das hbiles.  Para renovar recetas, por favor pida a su farmacia que se ponga en contacto con nuestra oficina. Annie Sable de fax es [place] [phone].  Si tiene un asunto urgente cuando la clnica est cerrada y que no puede esperar hasta el siguiente da hbil, puede llamar/localizar a su doctor(a) al nmero que aparece a continuacin.   Por favor, tenga en cuenta que aunque hacemos todo lo posible para estar disponibles para asuntos urgentes fuera del  horario de [place], no estamos disponibles las 24 horas del da, los 7 [address]  [address] de la [place].   Si tiene un problema urgente y no puede comunicarse con nosotros, puede optar por buscar atencin mdica  en el consultorio de su doctor(a), en una clnica privada, en un centro de atencin urgente o en una sala de emergencias.  Si tiene Engineer, drilling, por favor llame inmediatamente al 911 o vaya a la sala de emergencias.  Nmeros de bper  - Dr. Gwen Pounds: [phone]  - Dra. Roseanne Reno: [phone]  - Dr. Katrinka Blazing: [phone]   En caso de inclemencias del tiempo, por favor llame a Lacy Duverney principal al [phone] para una actualizacin sobre el [place] de cualquier retraso o cierre.  Consejos para la medicacin en dermatologa: Por favor, guarde las cajas en las que vienen los medicamentos de uso tpico para ayudarle a seguir las instrucciones sobre dnde y cmo usarlos. Las farmacias generalmente imprimen las instrucciones del medicamento slo en las cajas y no directamente en los tubos del [place].   Si su medicamento es muy caro, por favor, pngase en contacto con Rolm Gala llamando al [phone] y presione la opcin 4 o envenos un mensaje a travs de Clinical cytogeneticist.   No podemos decirle cul ser su copago por los medicamentos por adelantado ya que esto es diferente dependiendo de la cobertura de su seguro. Sin embargo, es posible que podamos encontrar un medicamento sustituto a Audiological scientist un formulario para que el seguro cubra el medicamento que se considera necesario.   Si se requiere una autorizacin previa para que su compaa de seguros [place] su medicamento, por favor permtanos de 1 a 2 das hbiles para completar [address]  Los precios de los medicamentos varan con frecuencia dependiendo del Environmental consultant de dnde se surte la receta y alguna farmacias pueden ofrecer precios ms baratos.  El sitio web www.goodrx.com tiene cupones para medicamentos de Engineer, civil (consulting). Los precios aqu no tienen en cuenta lo que podra costar con la ayuda del seguro (puede ser ms barato con su seguro), pero el sitio web puede darle el precio si no utiliz Tourist information centre manager.  - Puede imprimir el cupn correspondiente y llevarlo con su receta a la farmacia.  - Tambin puede pasar por nuestra oficina durante el horario de atencin regular y Education officer, museum una tarjeta de cupones de GoodRx.  -  Si necesita que su receta se enve electrnicamente a Psychiatrist, informe a nuestra oficina a travs de MyChart de [place] o por telfono llamando al [phone] y presione la opcin 4.

## 2022-10-22 NOTE — Progress Notes (Signed)
   New Patient Visit   Subjective  Jonathan Carter is a 62 y.o. male who presents for the following: Skin Cancer Screening and Full Body Skin Exam. No personal or family Hx of skin cancer or dysplastic nevi.  The patient presents for Total-Body Skin Exam (TBSE) for skin cancer screening and mole check. The patient has spots, moles and lesions to be evaluated, some may be new or changing and the patient may have concern these could be cancer.    The following portions of the chart were reviewed this encounter and updated as appropriate: medications, allergies, medical history  Review of Systems:  No other skin or systemic complaints except as noted in HPI or Assessment and Plan.  Objective  Well appearing patient in no apparent distress; mood and affect are within normal limits.  A full examination was performed including scalp, head, eyes, ears, nose, lips, neck, chest, axillae, abdomen, back, buttocks, bilateral upper extremities, bilateral lower extremities, hands, feet, fingers, toes, fingernails, and toenails. All findings within normal limits unless otherwise noted below.   Relevant physical exam findings are noted in the Assessment and Plan.    Assessment & Plan   SKIN CANCER SCREENING PERFORMED TODAY.  ACTINIC DAMAGE - Chronic condition, secondary to cumulative UV/sun exposure - diffuse scaly erythematous macules with underlying dyspigmentation - Recommend daily broad spectrum sunscreen SPF 30+ to sun-exposed areas, reapply every 2 hours as needed.  - Staying in the shade or wearing long sleeves, sun glasses (UVA+UVB protection) and wide brim hats (4-inch brim around the entire circumference of the hat) are also recommended for sun protection.  - Call for new or changing lesions.  LENTIGINES, SEBORRHEIC KERATOSES, HEMANGIOMAS - Benign normal skin lesions - Benign-appearing - Call for any changes  MELANOCYTIC NEVI - Tan-brown and/or pink-flesh-colored symmetric macules  and papules - Benign appearing on exam today - Observation - Call clinic for new or changing moles - Recommend daily use of broad spectrum spf 30+ sunscreen to sun-exposed areas.    MELANOCYTIC NEVI Exam: 2.5 mm flesh colored papule at right upper forehead. Treatment Plan: Benign appearing on exam today. Recommend observation. Call clinic for new or changing moles. Recommend daily use of broad spectrum spf 30+ sunscreen to sun-exposed areas.    Return for TBSE in 1-2 years.  I, Lawson Radar, CMA, am acting as scribe for Armida Sans, MD.   Documentation: I have reviewed the above documentation for accuracy and completeness, and I agree with the above.  Armida Sans, MD

## 2022-11-25 DIAGNOSIS — Z860101 Personal history of adenomatous and serrated colon polyps: Secondary | ICD-10-CM | POA: Diagnosis not present

## 2022-11-25 DIAGNOSIS — K5730 Diverticulosis of large intestine without perforation or abscess without bleeding: Secondary | ICD-10-CM | POA: Diagnosis not present

## 2022-12-09 ENCOUNTER — Encounter: Payer: Self-pay | Admitting: Internal Medicine

## 2023-01-13 ENCOUNTER — Ambulatory Visit: Admission: RE | Admit: 2023-01-13 | Payer: 59 | Source: Home / Self Care | Admitting: Internal Medicine

## 2023-01-13 HISTORY — DX: Elevated prostate specific antigen (PSA): R97.20

## 2023-01-13 HISTORY — DX: Other nonrheumatic aortic valve disorders: I35.8

## 2023-01-13 HISTORY — DX: Nonrheumatic aortic (valve) insufficiency: I35.1

## 2023-01-13 HISTORY — DX: Calculus of kidney: N20.0

## 2023-01-13 HISTORY — DX: Testicular hypofunction: E29.1

## 2023-01-13 HISTORY — DX: Benign prostatic hyperplasia without lower urinary tract symptoms: N40.0

## 2023-01-13 HISTORY — DX: Hyperlipidemia, unspecified: E78.5

## 2023-01-13 SURGERY — COLONOSCOPY WITH PROPOFOL
Anesthesia: General

## 2023-01-15 ENCOUNTER — Other Ambulatory Visit: Payer: 59

## 2023-01-20 ENCOUNTER — Other Ambulatory Visit: Payer: 59

## 2023-01-20 DIAGNOSIS — R972 Elevated prostate specific antigen [PSA]: Secondary | ICD-10-CM

## 2023-01-20 DIAGNOSIS — E291 Testicular hypofunction: Secondary | ICD-10-CM

## 2023-01-21 ENCOUNTER — Encounter: Payer: Self-pay | Admitting: Urology

## 2023-01-21 ENCOUNTER — Ambulatory Visit: Payer: 59 | Admitting: Urology

## 2023-01-21 VITALS — BP 177/76 | HR 83 | Ht 72.0 in | Wt 205.0 lb

## 2023-01-21 DIAGNOSIS — E291 Testicular hypofunction: Secondary | ICD-10-CM | POA: Diagnosis not present

## 2023-01-21 DIAGNOSIS — R972 Elevated prostate specific antigen [PSA]: Secondary | ICD-10-CM

## 2023-01-21 DIAGNOSIS — N200 Calculus of kidney: Secondary | ICD-10-CM

## 2023-01-21 LAB — HEMOGLOBIN AND HEMATOCRIT, BLOOD
Hematocrit: 47.8 % (ref 37.5–51.0)
Hemoglobin: 15.9 g/dL (ref 13.0–17.7)

## 2023-01-21 LAB — TESTOSTERONE: Testosterone: 391 ng/dL (ref 264–916)

## 2023-01-21 LAB — PSA: Prostate Specific Ag, Serum: 8.2 ng/mL — ABNORMAL HIGH (ref 0.0–4.0)

## 2023-01-21 MED ORDER — TAMSULOSIN HCL 0.4 MG PO CAPS: 0.4000 mg | ORAL_CAPSULE | Freq: Every day | ORAL | 1 refills | Status: DC

## 2023-01-21 NOTE — Progress Notes (Signed)
 I, Maysun LITTIE Griffiths, acting as a scribe for Glendia JAYSON Barba, MD., have documented all relevant documentation on the behalf of Glendia JAYSON Barba, MD, as directed by Glendia JAYSON Barba, MD while in the presence of Glendia JAYSON Barba, MD.  01/21/2023 12:30 PM   Jonathan Carter February 09, 1960 [id number]  Referring provider: Epifanio Alm SQUIBB, MD [address] [place],  [place] [address]  Chief Complaint  Patient presents with   Hypogonadism   Urologic History: 1.  Nephrolithiasis Bilateral nonobstructing renal calculi   2.  History hypogonadism Previously on TRT Clomid  25 mg daily   3.  Elevated PSA Prostate MRI 08/06/22 with a 96 cc volume gland and no evidence of high-grade prostate carcinoma.  HPI: Jonathan Carter is a 63 y.o. male presents for follow-up visit.  No problem since his last visit. He thinks he may have passed a small stone last month.  No bothersome LUTS.  Denies disuria, gross hematuria.  No flank, abdominal or pelvic pain.  Remains on Clomid  and does feel he has better energy level.  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    Headache    migraines   Hyperlipidemia    Hypertension    no medication   Hypogonadism male    Kidney stones 01/13/2013   Nephrolithiasis    Nonrheumatic aortic (valve) insufficiency     Surgical History: Past Surgical History:  Procedure Laterality Date   COLONOSCOPY WITH PROPOFOL  N/A 04/06/2017   Procedure: COLONOSCOPY WITH PROPOFOL ;  Surgeon: Viktoria Lamar DASEN, MD;  Location[facility] END[facility]COPY;  Service: Endoscopy;   Laterality: N/A;   SHOULDER ARTHROSCOPY WITH OPEN ROTATOR CUFF REPAIR Right 09/06/2014   Procedure: SHOULDER ARTHROSCOPY WITH OPEN ROTATOR CUFF REPAIR;  Surgeon: Franky Cranker, MD;  Location: [facility] ORS;  Service: Orthopedics;  Laterality: Right;    Home Medications:  Allergies as of 01/21/2023       Reactions   Penicillins    Zosyn  [piperacillin -tazobactam In Dex]         Medication List        Accurate as of January 21, 2023 12:30 PM. If you have any questions, ask your nurse or doctor.          aspirin EC 81 MG tablet Take 81 mg by mouth Nightly.   CENTRUM SILVER PO Take 1 tablet by mouth at bedtime.   Cholecalciferol 50 MCG (2000 UT) Caps Take by mouth.   Clomid  50 MG tablet Generic drug: clomiPHENE  Take 1/2 tablet daily   diazepam  5 MG tablet Commonly known as: VALIUM  1 tab po 30 min prior to procedure   famotidine  20 MG tablet Commonly known as: PEPCID  Take 20 mg by mouth 2 (two) times daily.   Fish Oil + D3 1200-1000 MG-UNIT Caps Take 1 capsule by mouth Nightly.   FLUoxetine 20 MG capsule Commonly known as: PROZAC Take 20 mg by mouth at bedtime.   fluticasone 50 MCG/ACT nasal spray Commonly known as: FLONASE Place  1 spray into both nostrils daily as needed for allergies or rhinitis.   hydrochlorothiazide 25 MG tablet Commonly known as: HYDRODIURIL Take 25 mg by mouth daily.   RA Fish Oil 1000 MG Caps Take 1 capsule by mouth daily.   Red Yeast Rice 600 MG Caps Take 2 capsules by mouth daily.   sildenafil  100 MG tablet Commonly known as: VIAGRA  Take 1 tab 1 hour prior to intercourse   tamsulosin  0.4 MG Caps capsule Commonly known as: FLOMAX  Take 1 capsule (0.4 mg total) by mouth daily.        Allergies:  Allergies  Allergen Reactions   Penicillins    Zosyn  [Piperacillin -Tazobactam In Dex]     Social History:  reports that he has never smoked. He has never used smokeless tobacco. He reports that he does not drink alcohol and does  not use drugs.   Physical Exam: BP (!) 177/76   Pulse 83   Ht 6' (1.829 m)   Wt 205 lb (93 kg)   BMI 27.80 kg/m   Constitutional:  Alert and oriented, No acute distress. HEENT: [place] AT, moist mucus membranes.  Trachea midline, no masses. Cardiovascular: No clubbing, cyanosis, or edema. Respiratory: Normal respiratory effort, no increased work of breathing. GI: Abdomen is soft, nontender, nondistended, no abdominal masses Skin: No rashes, bruises or suspicious lesions. Neurologic: Grossly intact, no focal deficits, moving all 4 extremities. Psychiatric: Normal mood and affect.   Assessment & Plan:    1. Elevated PSA PSA bump significant, however prostate MRI July 2024 showed no evidence of high-grade cancer. He did ejaculate within 12 hours of having his blood drawn. Recommend a follow-up PSA in 4-6 weeks and if still elevated but baseline, recommend scheduling a standard prostate biopsy.   2. Bilateral nephrolithiasis Asymptomatic KUB 6 months  3. Hypogonadism Stable on Clomid  Testosterone , PSA, H/H 6 months.  I have reviewed the above documentation for accuracy and completeness, and I agree with the above.   Glendia JAYSON Barba, MD  [facility] [address], [address] [place], [place] [address] [phone]

## 2023-02-11 ENCOUNTER — Other Ambulatory Visit: Payer: Self-pay | Admitting: Urology

## 2023-02-13 ENCOUNTER — Other Ambulatory Visit: Payer: Self-pay | Admitting: Urology

## 2023-03-03 ENCOUNTER — Other Ambulatory Visit: Payer: Self-pay | Admitting: *Deleted

## 2023-03-03 DIAGNOSIS — R972 Elevated prostate specific antigen [PSA]: Secondary | ICD-10-CM

## 2023-03-04 ENCOUNTER — Other Ambulatory Visit: Payer: 59

## 2023-03-04 DIAGNOSIS — R972 Elevated prostate specific antigen [PSA]: Secondary | ICD-10-CM | POA: Diagnosis not present

## 2023-03-05 ENCOUNTER — Encounter: Payer: Self-pay | Admitting: Urology

## 2023-03-05 LAB — PSA: Prostate Specific Ag, Serum: 3.8 ng/mL (ref 0.0–4.0)

## 2023-03-06 DIAGNOSIS — I358 Other nonrheumatic aortic valve disorders: Secondary | ICD-10-CM | POA: Diagnosis not present

## 2023-03-06 DIAGNOSIS — I10 Essential (primary) hypertension: Secondary | ICD-10-CM | POA: Diagnosis not present

## 2023-03-06 DIAGNOSIS — E7800 Pure hypercholesterolemia, unspecified: Secondary | ICD-10-CM | POA: Diagnosis not present

## 2023-03-06 DIAGNOSIS — I351 Nonrheumatic aortic (valve) insufficiency: Secondary | ICD-10-CM | POA: Diagnosis not present

## 2023-04-21 ENCOUNTER — Other Ambulatory Visit: Payer: Self-pay | Admitting: *Deleted

## 2023-04-21 DIAGNOSIS — N200 Calculus of kidney: Secondary | ICD-10-CM

## 2023-04-23 ENCOUNTER — Other Ambulatory Visit: Payer: Self-pay

## 2023-04-23 ENCOUNTER — Other Ambulatory Visit: Payer: 59

## 2023-04-23 DIAGNOSIS — E291 Testicular hypofunction: Secondary | ICD-10-CM

## 2023-04-23 DIAGNOSIS — R972 Elevated prostate specific antigen [PSA]: Secondary | ICD-10-CM | POA: Diagnosis not present

## 2023-04-24 ENCOUNTER — Ambulatory Visit: Payer: Self-pay | Admitting: Urology

## 2023-04-24 LAB — HEMATOCRIT: Hematocrit: 47.3 % (ref 37.5–51.0)

## 2023-04-24 LAB — PSA: Prostate Specific Ag, Serum: 4.8 ng/mL — ABNORMAL HIGH (ref 0.0–4.0)

## 2023-04-24 LAB — TESTOSTERONE: Testosterone: 478 ng/dL (ref 264–916)

## 2023-06-12 DIAGNOSIS — E7800 Pure hypercholesterolemia, unspecified: Secondary | ICD-10-CM | POA: Diagnosis not present

## 2023-06-12 DIAGNOSIS — I10 Essential (primary) hypertension: Secondary | ICD-10-CM | POA: Diagnosis not present

## 2023-06-12 DIAGNOSIS — I351 Nonrheumatic aortic (valve) insufficiency: Secondary | ICD-10-CM | POA: Diagnosis not present

## 2023-06-12 DIAGNOSIS — E291 Testicular hypofunction: Secondary | ICD-10-CM | POA: Diagnosis not present

## 2023-06-12 DIAGNOSIS — Z8679 Personal history of other diseases of the circulatory system: Secondary | ICD-10-CM | POA: Diagnosis not present

## 2023-06-12 DIAGNOSIS — Z1331 Encounter for screening for depression: Secondary | ICD-10-CM | POA: Diagnosis not present

## 2023-06-12 DIAGNOSIS — K219 Gastro-esophageal reflux disease without esophagitis: Secondary | ICD-10-CM | POA: Diagnosis not present

## 2023-06-12 DIAGNOSIS — F411 Generalized anxiety disorder: Secondary | ICD-10-CM | POA: Diagnosis not present

## 2023-06-12 DIAGNOSIS — Z0000 Encounter for general adult medical examination without abnormal findings: Secondary | ICD-10-CM | POA: Diagnosis not present

## 2023-07-24 ENCOUNTER — Ambulatory Visit: Admitting: Urology

## 2023-08-25 ENCOUNTER — Ambulatory Visit
Admission: RE | Admit: 2023-08-25 | Discharge: 2023-08-25 | Disposition: A | Discharge status: Home / Self Care | Source: Ambulatory Visit | Attending: Urology | Admitting: Urology

## 2023-08-25 ENCOUNTER — Ambulatory Visit (INDEPENDENT_AMBULATORY_CARE_PROVIDER_SITE_OTHER): Admitting: Urology

## 2023-08-25 ENCOUNTER — Encounter: Payer: Self-pay | Admitting: Urology

## 2023-08-25 VITALS — BP 174/83 | HR 66 | Ht 72.0 in | Wt 210.0 lb

## 2023-08-25 DIAGNOSIS — E291 Testicular hypofunction: Secondary | ICD-10-CM | POA: Diagnosis not present

## 2023-08-25 DIAGNOSIS — N5201 Erectile dysfunction due to arterial insufficiency: Secondary | ICD-10-CM | POA: Diagnosis not present

## 2023-08-25 DIAGNOSIS — N200 Calculus of kidney: Secondary | ICD-10-CM | POA: Diagnosis not present

## 2023-08-25 DIAGNOSIS — R972 Elevated prostate specific antigen [PSA]: Secondary | ICD-10-CM

## 2023-08-25 NOTE — Progress Notes (Signed)
 08/25/2023 1:08 PM   Jonathan Carter 1960/03/05 [id number]  Referring provider: Epifanio Alm SQUIBB, MD [address] [place],  [place] [address]  Chief Complaint  Patient presents with   Nephrolithiasis   Follow-up    Urologic History: 1.  Nephrolithiasis Bilateral nonobstructing renal calculi   2.  History hypogonadism Previously on TRT Clomid  25 mg daily   3.  Elevated PSA Prostate MRI 08/06/22 with a 96 cc volume gland and no evidence of high-grade prostate carcinoma.   HPI: Jonathan Carter is a 63 y.o. male who presents for follow-up  No problems since last visit No bothersome LUTS Denies dysuria, gross hematuria Denies flank, abdominal or pelvic pain Remains on Clomid  Labs 04/23/2023: 478 ng/dL, PSA 4.8 Is having some difficulty achieving and maintaining an erection.  He did not desire to try sildenafil .  States Dr. 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    Headache    migraines   Hyperlipidemia    Hypertension    no medication   Hypogonadism male    Kidney stones 01/13/2013   Nephrolithiasis    Nonrheumatic aortic (valve) insufficiency     Surgical History: Past Surgical History:  Procedure Laterality Date   COLONOSCOPY WITH PROPOFOL  N/A 04/06/2017   Procedure: COLONOSCOPY WITH PROPOFOL ;  Surgeon: Viktoria Lamar DASEN, MD;  Location[facility] ENDOSCOPY;  Service: Endoscopy;  Laterality: N/A;   SHOULDER ARTHROSCOPY WITH OPEN ROTATOR CUFF REPAIR Right 09/06/2014   Procedure: SHOULDER ARTHROSCOPY WITH OPEN ROTATOR CUFF REPAIR;  Surgeon: Franky Cranker, MD;  Location: [facility] ORS;  Service: Orthopedics;  Laterality: Right;    Home Medications:  Allergies as of 08/25/2023       Reactions   Penicillins    Zosyn   [piperacillin -tazobactam In Dex]         Medication List        Accurate as of August 25, 2023  1:08 PM. If you have any questions, ask your nurse or doctor.          STOP taking these medications    Cholecalciferol 50 MCG (2000 UT) Caps Stopped by: Glendia JAYSON Barba   diazepam  5 MG tablet Commonly known as: VALIUM  Stopped by: Glendia JAYSON Barba   FLUoxetine 20 MG capsule Commonly known as: PROZAC Stopped by: Glendia JAYSON Barba   sildenafil  100 MG tablet Commonly known as: VIAGRA  Stopped by: Glendia JAYSON Barba   tamsulosin  0.4 MG Caps capsule Commonly known as: FLOMAX  Stopped by: Glendia JAYSON Barba       TAKE these medications    aspirin EC 81 MG tablet Take 81 mg by mouth Nightly.   CENTRUM SILVER PO Take 1 tablet by mouth at bedtime.   Clomid  50 MG tablet Generic drug: clomiPHENE  TAKE 1/2 TABLET BY MOUTH EVERY DAY   famotidine  20 MG tablet Commonly known as: PEPCID  Take 20 mg by mouth 2 (two) times daily.   Fish Oil + D3 1200-1000 MG-UNIT Caps Take 1 capsule by mouth Nightly.   fluticasone 50 MCG/ACT nasal spray Commonly known as: FLONASE Place 1 spray into both nostrils daily as needed for allergies or rhinitis.   hydrochlorothiazide 25 MG tablet Commonly known as: HYDRODIURIL Take 25 mg by mouth daily.   RA Fish Oil 1000  MG Caps Take 1 capsule by mouth daily.   Red Yeast Rice 600 MG Caps Take 2 capsules by mouth daily.        Allergies:  Allergies  Allergen Reactions   Penicillins    Zosyn  [Piperacillin -Tazobactam In Dex]     Family History: No family history on file.  Social History:  reports that he has never smoked. He has never used smokeless tobacco. He reports that he does not drink alcohol and does not use drugs.   Physical Exam: BP (!) 174/83 (BP Location: Left Arm, Patient Position: Sitting, Cuff Size: Normal)   Pulse 66   Ht 6' (1.829 m)   Wt 210 lb (95.3 kg)   SpO2 98%   BMI 28.48 kg/m   Constitutional:  Alert and  oriented, No acute distress. HEENT: [place] AT Respiratory: Normal respiratory effort, no increased work of breathing. Psychiatric: Normal mood and affect.  Pertinent imaging: KUB performed earlier today was personally reviewed and interpreted.  Stable small, bilateral calcifications overlying the renal outlines.  No calcifications identified overlying the expected course of the ureters   Assessment & Plan:    1.  Elevated PSA Stable PSA drawn today  2.  Bilateral nephrolithiasis Stable  3.  Hypogonadism Stable on Clomid   4.  Erectile dysfunction If no improvement in his ED after discontinuing his SSRI med he would be interested in a trial of tadalafil 5 mg daily   Continue annual follow-up   Glendia JAYSON Barba, MD  [facility] [address], [address] [place], [place] [address] [phone]

## 2023-08-26 LAB — PSA: Prostate Specific Ag, Serum: 4.2 ng/mL — ABNORMAL HIGH (ref 0.0–4.0)

## 2023-08-26 LAB — TESTOSTERONE: Testosterone: 399 ng/dL (ref 264–916)

## 2023-08-30 ENCOUNTER — Ambulatory Visit: Payer: Self-pay | Admitting: Urology

## 2023-09-06 ENCOUNTER — Encounter: Payer: Self-pay | Admitting: Urology

## 2023-09-08 ENCOUNTER — Telehealth: Payer: Self-pay | Admitting: *Deleted

## 2023-09-08 NOTE — Telephone Encounter (Signed)
 Pt would like to discuss Testerone or Gel.He is scheduled to follow up to discuss next month.

## 2023-09-23 ENCOUNTER — Ambulatory Visit (INDEPENDENT_AMBULATORY_CARE_PROVIDER_SITE_OTHER): Admitting: Urology

## 2023-09-23 ENCOUNTER — Encounter: Payer: Self-pay | Admitting: Urology

## 2023-09-23 VITALS — BP 183/67 | HR 83 | Ht 72.0 in | Wt 210.0 lb

## 2023-09-23 DIAGNOSIS — E291 Testicular hypofunction: Secondary | ICD-10-CM

## 2023-09-23 NOTE — Progress Notes (Signed)
 09/23/2023 3:04 PM   Jonathan Carter 1961-01-01 [id number]  Referring provider: Epifanio Alm SQUIBB, MD [address] [place],  [place] [address]  Chief Complaint  Patient presents with   Hypogonadism    Urologic History: 1.  Nephrolithiasis Bilateral nonobstructing renal calculi   2.  History hypogonadism Previously on TRT Clomid  25 mg daily   3.  Elevated PSA Prostate MRI 08/06/22 with a 96 cc volume gland and no evidence of high-grade prostate carcinoma.   HPI: Jonathan Carter is a 63 y.o. male called for an appointment to discuss testosterone  replacement therapy  Has been on Clomid  for several years.  He had a testosterone  level 09/12/2020 which was 223 ng/dL.  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    Headache    migraines   Hyperlipidemia    Hypertension    no medication   Hypogonadism male    Kidney stones 01/13/2013   Nephrolithiasis    Nonrheumatic aortic (valve) insufficiency     Surgical History: Past Surgical History:  Procedure Laterality Date   COLONOSCOPY WITH PROPOFOL  N/A 04/06/2017   Procedure: COLONOSCOPY WITH PROPOFOL ;  Surgeon: Viktoria Lamar DASEN, MD;  Location: [facility] ENDOSCOPY;  Service: Endoscopy;  Laterality: N/A;   SHOULDER ARTHROSCOPY WITH OPEN ROTATOR CUFF REPAIR Right 09/06/2014   Procedure: SHOULDER ARTHROSCOPY WITH OPEN ROTATOR CUFF REPAIR;  Surgeon: Franky Cranker, MD;  Location: [facility] ORS;  Service: Orthopedics;  Laterality: Right;    Home Medications:  Allergies as of 09/23/2023       Reactions   Penicillins    Zosyn  [piperacillin -tazobactam In Dex]         Medication List        Accurate as of September 23, 2023  3:04 PM. If you have any questions, ask your nurse  or doctor.          aspirin EC 81 MG tablet Take 81 mg by mouth Nightly.   CENTRUM SILVER PO Take 1 tablet by mouth at bedtime.   Clomid  50 MG tablet Generic drug: clomiPHENE  TAKE 1/2 TABLET BY MOUTH EVERY DAY   famotidine  20 MG tablet Commonly known as: PEPCID  Take 20 mg by mouth 2 (two) times daily.   Fish Oil + D3 1200-1000 MG-UNIT Caps Take 1 capsule by mouth Nightly.   fluticasone 50 MCG/ACT nasal spray Commonly known as: FLONASE Place 1 spray into both nostrils daily as needed for allergies or rhinitis.   hydrochlorothiazide 25 MG tablet Commonly known as: HYDRODIURIL Take 25 mg by mouth daily.   RA Fish Oil 1000 MG Caps Take 1 capsule by mouth daily.   Red Yeast Rice 600 MG Caps Take 2 capsules by mouth daily.        Allergies:  Allergies  Allergen Reactions   Penicillins    Zosyn  [Piperacillin -Tazobactam In Dex]     Family History: No family history on file.  Social History:  reports that he has never smoked. He has never used smokeless tobacco. He reports that he does not drink alcohol and does not use drugs.   Physical Exam: BP (!) 183/67   Pulse 83   Ht 6' (1.829 m)   Wt 210 lb (95.3 kg)   BMI 28.48 kg/m  Constitutional:  Alert and oriented, No acute distress. HEENT: [place] AT Respiratory: Normal respiratory effort, no increased work of breathing. Psychiatric: Normal mood and affect.   Assessment & Plan:    1.  Hypogonadism We discussed AUA guidelines for treatment of low testosterone  recommend to levels < 300 with symptoms He will discontinue his Clomid  and follow-up lab visit scheduled ~6 weeks for a.m. testosterone  level We discussed options of TRT typically covered by insurance include testosterone  gel and intramuscular injections.  At this time he is interested in topical testosterone     Glendia JAYSON Barba, MD  [facility] [address], [address] [place], [place] [address] [phone]

## 2023-09-30 ENCOUNTER — Ambulatory Visit: Admitting: Urology

## 2023-10-09 ENCOUNTER — Other Ambulatory Visit: Payer: Self-pay | Admitting: Urology

## 2023-10-22 ENCOUNTER — Ambulatory Visit: Payer: 59 | Admitting: Dermatology

## 2023-10-27 ENCOUNTER — Encounter: Payer: Self-pay | Admitting: Dermatology

## 2023-10-27 ENCOUNTER — Ambulatory Visit (INDEPENDENT_AMBULATORY_CARE_PROVIDER_SITE_OTHER): Admitting: Dermatology

## 2023-10-27 DIAGNOSIS — Z1283 Encounter for screening for malignant neoplasm of skin: Secondary | ICD-10-CM | POA: Diagnosis not present

## 2023-10-27 DIAGNOSIS — D2239 Melanocytic nevi of other parts of face: Secondary | ICD-10-CM

## 2023-10-27 DIAGNOSIS — Z7189 Other specified counseling: Secondary | ICD-10-CM

## 2023-10-27 DIAGNOSIS — L820 Inflamed seborrheic keratosis: Secondary | ICD-10-CM | POA: Diagnosis not present

## 2023-10-27 DIAGNOSIS — W908XXA Exposure to other nonionizing radiation, initial encounter: Secondary | ICD-10-CM | POA: Diagnosis not present

## 2023-10-27 DIAGNOSIS — L821 Other seborrheic keratosis: Secondary | ICD-10-CM | POA: Diagnosis not present

## 2023-10-27 DIAGNOSIS — D2372 Other benign neoplasm of skin of left lower limb, including hip: Secondary | ICD-10-CM

## 2023-10-27 DIAGNOSIS — D229 Melanocytic nevi, unspecified: Secondary | ICD-10-CM

## 2023-10-27 DIAGNOSIS — L814 Other melanin hyperpigmentation: Secondary | ICD-10-CM | POA: Diagnosis not present

## 2023-10-27 DIAGNOSIS — D239 Other benign neoplasm of skin, unspecified: Secondary | ICD-10-CM

## 2023-10-27 DIAGNOSIS — L578 Other skin changes due to chronic exposure to nonionizing radiation: Secondary | ICD-10-CM

## 2023-10-27 NOTE — Progress Notes (Signed)
   Follow-Up Visit   Subjective  Jonathan Carter is a 63 y.o. male who presents for the following: Skin Cancer Screening and Full Body Skin Exam  The patient presents for Total-Body Skin Exam (TBSE) for skin cancer screening and mole check. The patient has spots, moles and lesions to be evaluated, some may be new or changing and the patient may have concern these could be cancer.  The following portions of the chart were reviewed this encounter and updated as appropriate: medications, allergies, medical history  Review of Systems:  No other skin or systemic complaints except as noted in HPI or Assessment and Plan.  Objective  Well appearing patient in no apparent distress; mood and affect are within normal limits.  A full examination was performed including scalp, head, eyes, ears, nose, lips, neck, chest, axillae, abdomen, back, buttocks, bilateral upper extremities, bilateral lower extremities, hands, feet, fingers, toes, fingernails, and toenails. All findings within normal limits unless otherwise noted below.   Relevant physical exam findings are noted in the Assessment and Plan.  left temple x 5, left infraorbital x 1 (6) Erythematous stuck-on, waxy papule or plaque  Assessment & Plan   SKIN CANCER SCREENING PERFORMED TODAY.  ACTINIC DAMAGE - Chronic condition, secondary to cumulative UV/sun exposure - diffuse scaly erythematous macules with underlying dyspigmentation - Recommend daily broad spectrum sunscreen SPF 30+ to sun-exposed areas, reapply every 2 hours as needed.  - Staying in the shade or wearing long sleeves, sun glasses (UVA+UVB protection) and wide brim hats (4-inch brim around the entire circumference of the hat) are also recommended for sun protection.  - Call for new or changing lesions.  LENTIGINES, SEBORRHEIC KERATOSES, HEMANGIOMAS - Benign normal skin lesions - Benign-appearing - Call for any changes  MELANOCYTIC NEVI - Tan-brown and/or  pink-flesh-colored symmetric macules and papules - Benign appearing on exam today - Observation - Call clinic for new or changing moles - Recommend daily use of broad spectrum spf 30+ sunscreen to sun-exposed areas.   MELANOCYTIC NEVI Exam: 2.5 mm flesh colored papule at right upper forehead. Treatment Plan: Benign appearing on exam today. Recommend observation. Call clinic for new or changing moles. Recommend daily use of broad spectrum spf 30+ sunscreen to sun-exposed areas.  DERMATOFIBROMA Exam: Firm pink/brown papulenodule with dimple sign at left medial knee  Treatment Plan: A dermatofibroma is a benign growth possibly related to trauma, such as an insect bite, cut from shaving, or inflamed acne-type bump.  Treatment options to remove include shave or excision with resulting scar and risk of recurrence.  Since benign-appearing and not bothersome, will observe for now.    INFLAMED SEBORRHEIC KERATOSIS (6) left temple x 5, left infraorbital x 1 (6) Symptomatic, irritating, patient would like treated. Destruction of lesion - left temple x 5, left infraorbital x 1 (6) Complexity: simple   Destruction method: cryotherapy   Informed consent: discussed and consent obtained   Timeout:  patient name, date of birth, surgical site, and procedure verified Lesion destroyed using liquid nitrogen: Yes   Region frozen until ice ball extended beyond lesion: Yes   Outcome: patient tolerated procedure well with no complications   Post-procedure details: wound care instructions given    Return for 1 - 2 year tbse .  IEleanor Blush, CMA, am acting as scribe for Alm Rhyme, MD.   Documentation: I have reviewed the above documentation for accuracy and completeness, and I agree with the above.  Alm Rhyme, MD

## 2023-10-27 NOTE — Patient Instructions (Addendum)
 Seborrheic Keratosis  What causes seborrheic keratoses? Seborrheic keratoses are harmless, common skin growths that first appear during adult life.  As time goes by, more growths appear.  Some people may develop a large number of them.  Seborrheic keratoses appear on both covered and uncovered body parts.  They are not caused by sunlight.  The tendency to develop seborrheic keratoses can be inherited.  They vary in color from skin-colored to gray, brown, or even black.  They can be either smooth or have a rough, warty surface.   Seborrheic keratoses are superficial and look as if they were stuck on the skin.  Under the microscope this type of keratosis looks like layers upon layers of skin.  That is why at times the top layer may seem to fall off, but the rest of the growth remains and re-grows.    Treatment Seborrheic keratoses do not need to be treated, but can easily be removed in the office.  Seborrheic keratoses often cause symptoms when they rub on clothing or jewelry.  Lesions can be in the way of shaving.  If they become inflamed, they can cause itching, soreness, or burning.  Removal of a seborrheic keratosis can be accomplished by freezing, burning, or surgery. If any spot bleeds, scabs, or grows rapidly, please return to have it checked, as these can be an indication of a skin cancer.   Cryotherapy Aftercare  Wash gently with soap and water everyday.   Apply Vaseline and Band-Aid daily until healed.     Melanoma ABCDEs  Melanoma is the most dangerous type of skin cancer, and is the leading cause of death from skin disease.  You are more likely to develop melanoma if you: Have light-colored skin, light-colored eyes, or red or blond hair Spend a lot of time in the sun Tan regularly, either outdoors or in a tanning bed Have had blistering sunburns, especially during childhood Have a close family member who has had a melanoma Have atypical moles or large birthmarks  Early detection  of melanoma is key since treatment is typically straightforward and cure rates are extremely high if we catch it early.   The first sign of melanoma is often a change in a mole or a new dark spot.  The ABCDE system is a way of remembering the signs of melanoma.  A for asymmetry:  The two halves do not match. B for border:  The edges of the growth are irregular. C for color:  A mixture of colors are present instead of an even brown color. D for diameter:  Melanomas are usually (but not always) greater than 6mm - the size of a pencil eraser. E for evolution:  The spot keeps changing in size, shape, and color.  Please check your skin once per month between visits. You can use a small mirror in front and a large mirror behind you to keep an eye on the back side or your body.   If you see any new or changing lesions before your next follow-up, please call to schedule a visit.  Please continue daily skin protection including broad spectrum sunscreen SPF 30+ to sun-exposed areas, reapplying every 2 hours as needed when you're outdoors.   Staying in the shade or wearing long sleeves, sun glasses (UVA+UVB protection) and wide brim hats (4-inch brim around the entire circumference of the hat) are also recommended for sun protection.     Due to recent changes in healthcare laws, you may see results of your  pathology and/or laboratory studies on MyChart before the doctors have had a chance to review them. We understand that in some cases there may be results that are confusing or concerning to you. Please understand that not all results are received at the same time and often the doctors may need to interpret multiple results in order to provide you with the best plan of care or course of treatment. Therefore, we ask that you please give us  2 business days to thoroughly review all your results before contacting the office for clarification. Should we see a critical lab result, you will be contacted  sooner.   If You Need Anything After Your Visit  If you have any questions or concerns for your doctor, please call our main line at [phone] and press option 4 to reach your doctor's medical assistant. If no one answers, please leave a voicemail as directed and we will return your call as soon as possible. Messages left after 4 pm will be answered the following business day.   You may also send us  a message via MyChart. We typically respond to MyChart messages within 1-2 business days.  For prescription refills, please ask your pharmacy to contact our office. Our fax number is [phone].  If you have an urgent issue when the clinic is closed that cannot wait until the next business day, you can page your doctor at the number below.    Please note that while we do our best to be available for urgent issues outside of office hours, we are not available 24/7.   If you have an urgent issue and are unable to reach us , you may choose to seek medical care at your doctor's office, retail clinic, urgent care center, or emergency room.  If you have a medical emergency, please immediately call 911 or go to the emergency department.  Pager Numbers  - Dr. Hester: [phone]  - Dr. Jackquline: [phone]  - Dr. Claudene: [phone]   - Dr. Raymund: [phone]  In the event of inclement weather, please call our main line at [phone] for an update on the status of any delays or closures.  Dermatology Medication Tips: Please keep the boxes that topical medications come in in order to help keep track of the instructions about where and how to use these. Pharmacies typically print the medication instructions only on the boxes and not directly on the medication tubes.   If your medication is too expensive, please contact our office at [phone] option 4 or send us  a message through MyChart.   We are unable to tell what your co-pay for medications will be in advance as this is  different depending on your insurance coverage. However, we may be able to find a substitute medication at lower cost or fill out paperwork to get insurance to cover a needed medication.   If a prior authorization is required to get your medication covered by your insurance company, please allow us  1-2 business days to complete this process.  Drug prices often vary depending on where the prescription is filled and some pharmacies may offer cheaper prices.  The website www.goodrx.com contains coupons for medications through different pharmacies. The prices here do not account for what the cost may be with help from insurance (it may be cheaper with your insurance), but the website can give you the price if you did not use any insurance.  - You can print the associated coupon and take it with your prescription to the pharmacy.  -  You may also stop by our office during regular business hours and pick up a GoodRx coupon card.  - If you need your prescription sent electronically to a different pharmacy, notify our office through [facility] or by phone at [phone] option 4.     Si Usted Necesita Algo Despus de Su Visita  Tambin puede enviarnos un mensaje a travs de Clinical cytogeneticist. Por lo general respondemos a los mensajes de MyChart en el transcurso de 1 a 2 das hbiles.  Para renovar recetas, por favor pida a su farmacia que se ponga en contacto con nuestra oficina. Randi lakes de fax es [place] [phone].  Si tiene un asunto urgente cuando la clnica est cerrada y que no puede esperar hasta el siguiente da hbil, puede llamar/localizar a su doctor(a) al nmero que aparece a continuacin.   Por favor, tenga en cuenta que aunque hacemos todo lo posible para estar disponibles para asuntos urgentes fuera del horario de [place], no estamos disponibles las 24 horas del da, los 7 [address]  [address] de la [place].   Si tiene un problema urgente y no puede comunicarse con nosotros, puede optar por buscar  atencin mdica  en el consultorio de su doctor(a), en una clnica privada, en un centro de atencin urgente o en una sala de emergencias.  Si tiene Engineer, drilling, por favor llame inmediatamente al 911 o vaya a la sala de emergencias.  Nmeros de bper  - Dr. Hester: [phone]  - Dra. Jackquline: [phone]  - Dr. Claudene: [phone]  - Dra. Kitts: [phone]  En caso de inclemencias del [place], por favor llame a nuestra lnea principal al [phone] para una actualizacin sobre el estado de cualquier retraso o cierre.  Consejos para la medicacin en dermatologa: Por favor, guarde las cajas en las que vienen los medicamentos de uso tpico para ayudarle a seguir las instrucciones sobre dnde y cmo usarlos. Las farmacias generalmente imprimen las instrucciones del medicamento slo en las cajas y no directamente en los tubos del [place].   Si su medicamento es muy caro, por favor, pngase en contacto con landry rieger llamando al [phone] y presione la opcin 4 o envenos un mensaje a travs de Clinical cytogeneticist.   No podemos decirle cul ser su copago por los medicamentos por adelantado ya que esto es diferente dependiendo de la cobertura de su seguro. Sin embargo, es posible que podamos encontrar un medicamento sustituto a Audiological scientist un formulario para que el seguro cubra el medicamento que se considera necesario.   Si se requiere una autorizacin previa para que su compaa de seguros malta su medicamento, por favor permtanos de 1 a 2 das hbiles para completar este proceso.  Los precios de los medicamentos varan con frecuencia dependiendo del Environmental consultant de dnde se surte la receta y alguna farmacias pueden ofrecer precios ms baratos.  El sitio web www.goodrx.com tiene cupones para medicamentos de Health and safety inspector. Los precios aqu no tienen en cuenta lo que podra costar con la ayuda del seguro (puede ser ms barato con su seguro), pero el sitio web puede  darle el precio si no utiliz Tourist information centre manager.  - Puede imprimir el cupn correspondiente y llevarlo con su receta a la farmacia.  - Tambin puede pasar por nuestra oficina durante el horario de atencin regular y Education officer, museum una tarjeta de cupones de GoodRx.  - Si necesita que su receta se enve electrnicamente a Psychiatrist, informe a nuestra oficina a travs de MyChart de [employer] o por  telfono llamando al [phone] y presione la opcin 4.

## 2023-11-06 ENCOUNTER — Other Ambulatory Visit

## 2023-11-09 ENCOUNTER — Other Ambulatory Visit

## 2023-11-09 DIAGNOSIS — E291 Testicular hypofunction: Secondary | ICD-10-CM

## 2023-11-10 LAB — TESTOSTERONE: Testosterone: 260 ng/dL — ABNORMAL LOW (ref 264–916)

## 2023-11-12 ENCOUNTER — Ambulatory Visit: Payer: Self-pay | Admitting: Urology

## 2023-11-12 ENCOUNTER — Other Ambulatory Visit: Payer: Self-pay | Admitting: Urology

## 2023-11-12 MED ORDER — TESTOSTERONE 20.25 MG/ACT (1.62%) TD GEL: TRANSDERMAL | 1 refills | Status: DC

## 2024-01-04 ENCOUNTER — Encounter: Payer: Self-pay | Admitting: Urology

## 2024-01-04 ENCOUNTER — Other Ambulatory Visit: Payer: Self-pay | Admitting: Urology

## 2024-01-04 ENCOUNTER — Other Ambulatory Visit

## 2024-01-04 DIAGNOSIS — E291 Testicular hypofunction: Secondary | ICD-10-CM

## 2024-01-05 LAB — TESTOSTERONE: Testosterone: 309 ng/dL (ref 264–916)

## 2024-02-25 ENCOUNTER — Other Ambulatory Visit

## 2024-08-24 ENCOUNTER — Other Ambulatory Visit

## 2024-08-24 ENCOUNTER — Ambulatory Visit: Admitting: Urology

## 2024-10-27 ENCOUNTER — Ambulatory Visit: Admitting: Dermatology
